# Patient Record
Sex: Male | Born: 1970
Health system: Southern US, Community
[De-identification: ages and names within clinical notes are randomized; demographics above are authoritative.]

## PROBLEM LIST (undated history)

## (undated) DIAGNOSIS — R011 Cardiac murmur, unspecified: Secondary | ICD-10-CM

## (undated) DIAGNOSIS — K5790 Diverticulosis of intestine, part unspecified, without perforation or abscess without bleeding: Secondary | ICD-10-CM

## (undated) DIAGNOSIS — I10 Essential (primary) hypertension: Secondary | ICD-10-CM

## (undated) DIAGNOSIS — R002 Palpitations: Secondary | ICD-10-CM

## (undated) DIAGNOSIS — K219 Gastro-esophageal reflux disease without esophagitis: Secondary | ICD-10-CM

## (undated) DIAGNOSIS — K648 Other hemorrhoids: Secondary | ICD-10-CM

## (undated) DIAGNOSIS — C8190 Hodgkin lymphoma, unspecified, unspecified site: Secondary | ICD-10-CM

## (undated) DIAGNOSIS — D126 Benign neoplasm of colon, unspecified: Secondary | ICD-10-CM

## (undated) HISTORY — PX: OTHER SURGICAL HISTORY: SHX169

## (undated) HISTORY — DX: Palpitations: R00.2

## (undated) HISTORY — PX: LYMPH NODE BIOPSY: SHX201

## (undated) HISTORY — DX: Gastro-esophageal reflux disease without esophagitis: K21.9

## (undated) HISTORY — DX: Cardiac murmur, unspecified: R01.1

## (undated) HISTORY — DX: Diverticulosis of intestine, part unspecified, without perforation or abscess without bleeding: K57.90

## (undated) HISTORY — DX: Essential (primary) hypertension: I10

## (undated) HISTORY — DX: Other hemorrhoids: K64.8

## (undated) HISTORY — DX: Hodgkin lymphoma, unspecified, unspecified site: C81.90

## (undated) HISTORY — DX: Benign neoplasm of colon, unspecified: D12.6

---

## 2010-05-29 HISTORY — PX: COLONOSCOPY: SHX174

## 2012-05-29 DIAGNOSIS — C8190 Hodgkin lymphoma, unspecified, unspecified site: Secondary | ICD-10-CM

## 2012-05-29 HISTORY — DX: Hodgkin lymphoma, unspecified, unspecified site: C81.90

## 2016-11-10 ENCOUNTER — Telehealth: Payer: Self-pay | Admitting: Hematology

## 2016-11-10 ENCOUNTER — Encounter: Payer: Self-pay | Admitting: Hematology

## 2016-11-10 NOTE — Telephone Encounter (Signed)
Appt has been scheduled for the pt to see Dr. Irene Limbo on 7/19 at 3pm. Pt agreed to the appt date and time. Demographics verified. Letter mailed to the pt and faxed to the referring.

## 2016-12-14 ENCOUNTER — Telehealth: Payer: Self-pay | Admitting: Hematology

## 2016-12-14 ENCOUNTER — Ambulatory Visit (HOSPITAL_BASED_OUTPATIENT_CLINIC_OR_DEPARTMENT_OTHER): Payer: 59 | Admitting: Hematology

## 2016-12-14 VITALS — BP 138/74 | HR 82 | Temp 98.0°F | Resp 20 | Ht 72.0 in | Wt 256.2 lb

## 2016-12-14 DIAGNOSIS — Z8571 Personal history of Hodgkin lymphoma: Secondary | ICD-10-CM

## 2016-12-14 DIAGNOSIS — C8118 Nodular sclerosis classical Hodgkin lymphoma, lymph nodes of multiple sites: Secondary | ICD-10-CM

## 2016-12-14 DIAGNOSIS — I10 Essential (primary) hypertension: Secondary | ICD-10-CM

## 2016-12-14 DIAGNOSIS — E669 Obesity, unspecified: Secondary | ICD-10-CM | POA: Diagnosis not present

## 2016-12-14 NOTE — Telephone Encounter (Signed)
Scheduled appt per 7/19 los - Gave patient AVS and calender per los- lab appt today not scheduled due to lab closed -patient to call back and r/s .

## 2016-12-14 NOTE — Progress Notes (Signed)
[name]    HEMATOLOGY/ONCOLOGY CONSULTATION NOTE  Date of Service: 12/14/2016  Patient Care Team: Glenford Bayley, DO as PCP - General (Family Medicine)  CHIEF COMPLAINTS/PURPOSE OF CONSULTATION:  History of Hodgkin's lymphoma  HISTORY OF PRESENTING ILLNESS:  Marcus [name] is a wonderful 46 y.o. male who has been referred to [place] by Dr .Marin Comment, Tilden Fossa, DO for evaluation and continued management of Hodgkin's lymphoma.  Patient has previously been seen by Dr. Nicki Guadalajara MD at [facility] in [facility] for management of his Hodgkin's [place] and recently moved to [facility] in January 2018 and wanted to continue his follow-up for Hodgkin's lymphoma here.  Patient presented with bilateral cervical lymphadenopathy first noted in December 2013 and then evaluated with a CT of the neck in January 2014. He notes that he also had some night sweats and some fatigue along with the lymphadenopathy. Patient had a lymph node biopsy of the neck initially which was nondiagnostic.    patient had a PET CT scan on 07/11/2012 which showed extensive nodal hypermetabolic lesion throughout the outer neck bilaterally and mediastinum with SUV up to 11.6   Patient required 2 biopsies of his mediastinal nodes by interventional radiology to make a diagnosis of Hodgkin's lymphoma nodular sclerosis type in March 2014.   patient was treated with ABVD for 6 cycles February through September 2014  He has been in complete remission since then. His last clinic visit with medical oncology was in August 2017 when he had no clinical or lab findings suggestive of disease recurrence. He missed a follow-up in February 2018 since he was in the process of moving to [place].  Patient notes that he is enjoying his job in [place]. He notes no overt new enlarged lymph nodes. No fevers no chills no night sweats no unexpected weight loss. No chest pain no shortness of breath. No abdominal pain or distention. No new skin  rashes.   MEDICAL HISTORY:  #1 history of Hodgkin's lymphoma diagnosed in April 2014 status post ABVD 6 cycles completed in September 2014. Has remained in complete remission since then. #2 obesity #3 hypertension  SURGICAL HISTORY: No past surgical history on file.  SOCIAL HISTORY: Social History   Social History  . Marital status: Unknown    Spouse name: N/A  . Number of children: N/A  . Years of education: N/A   Occupational History  . Not on file.   Social History Main Topics  . Smoking status: Not on file  . Smokeless tobacco: Not on file  . Alcohol use Not on file  . Drug use: Unknown  . Sexual activity: Not on file   Other Topics Concern  . Not on file   Social History Narrative  . No narrative on file  Works as a Optician, dispensing. Married. Has one son Recently moved to [facility] from [place] in January 2018. Remote history of smoking quit in 2011. Social alcohol use Denies any other drug use  FAMILY HISTORY:  Father had nephrectomy for kidney cancer in 68 Mother had non-Hodgkin's lymphoma followed by Hodgkin's lymphoma and eventually died from adenocarcinoma of unknown primary.   ALLERGIES:  has no allergies on file.  MEDICATIONS:  Current Outpatient Prescriptions  Medication Sig Dispense Refill  . losartan (COZAAR) 25 MG tablet Take 25 mg by mouth daily.  5  . SF 5000 PLUS 1.1 % CREA dental cream Take 1 application by mouth daily.  0   No current facility-administered  medications for this visit.     REVIEW OF SYSTEMS:    10 Point review of Systems was done is negative except as noted above.  PHYSICAL EXAMINATION: ECOG PERFORMANCE STATUS: 0 - Asymptomatic  . Vitals:   12/14/16 1527  BP: 138/74  Pulse: 82  Resp: 20  Temp: 98 F (36.7 C)   Filed Weights   12/14/16 1527  Weight: 256 lb 3.2 oz (116.2 kg)   .Body mass index is 34.75 kg/m.  GENERAL:alert, in no acute distress and  comfortable SKIN: no acute rashes, no significant lesions EYES: conjunctiva are pink and non-injected, sclera anicteric OROPHARYNX: MMM, no exudates, no oropharyngeal erythema or ulceration NECK: supple, no JVD LYMPH:  no palpable lymphadenopathy in the cervical, axillary or inguinal regions LUNGS: clear to auscultation b/l with normal respiratory effort HEART: regular rate & rhythm ABDOMEN:  normoactive bowel sounds , non tender, not distended. No palpable hepatosplenomegaly. Extremity: no pedal edema PSYCH: alert & oriented x 3 with fluent speech NEURO: no focal motor/sensory deficits  LABORATORY DATA:  I have reviewed the data as listed  Labs pending from today  RADIOGRAPHIC STUDIES: I have personally reviewed the radiological images as listed and agreed with the findings in the report. No results found.  ASSESSMENT & PLAN:   46 yo with   1) h/o Classical Hodgkins Lymphoma -Nodular sclerosis type Stage IIB  Diagnosed in January 2014 Treated with ABVD 6 cycles completed in September 2014. Patient has remained in complete remission since then. Plan -Patient has no clinical evidence of Hodgkin's lymphoma recurrence at this time. -We shall get labs including CBC with differential, CMP and sedimentation rate today. -Unless overtly concerning lab abnormalities noted there is no recommendation to get routine whole-body scans at this time. -Will plan to follow him up in 6 months with repeat labs and clinical examination. -All the outside records were reviewed in details with the patient and confirmed. -He has no residual prohibitive toxicities from his ABVD treatment. -Encouraged healthy eating and active lifestyle and weight loss to reduce cardiovascular risk.  2) HTN - on losartan  3) Obesity - counseling and active lifestyle and appropriate diet.  Labs today RTC with Dr Irene Limbo in 6 months with rpt labs  All of the patients questions were answered with apparent satisfaction.  The patient knows to call the clinic with any problems, questions or concerns.  I spent 40 minutes counseling the patient face to face. The total time spent in the appointment was 60 minutes and more than 50% was on counseling and direct patient cares.    Dittman Lone MD [place] AAHIVMS [facility] Hematology/Oncology Physician [facility]  (Office):       [phone] (Work cell):  [phone] (Fax):           [phone]  12/14/2016 2:48 PM

## 2017-05-01 ENCOUNTER — Telehealth: Payer: Self-pay

## 2017-05-01 NOTE — Telephone Encounter (Signed)
SENT NOTES TO SCHEDULING 

## 2017-06-06 ENCOUNTER — Telehealth: Payer: Self-pay | Admitting: Cardiology

## 2017-06-06 NOTE — Telephone Encounter (Signed)
06/06/2017 Received faxed referral from [facility] for upcoming appointment with Dr. Martinique on 06/25/2017.  Records given to [facility]. cbr

## 2017-06-15 ENCOUNTER — Ambulatory Visit: Payer: 59 | Admitting: Hematology

## 2017-06-15 ENCOUNTER — Other Ambulatory Visit: Payer: 59

## 2017-06-20 NOTE — Progress Notes (Signed)
Cardiology Office Note   Date:  06/25/2017   ID:  Marcus [name], DOB 03-Mar-1971, MRN [id number]  PCP:  Glenford Bayley, DO  Cardiologist:   Tiwanna Tuch Martinique, MD   Chief Complaint  Patient presents with  . Follow-up    NP. 2007 DX of palpitations.  . Shortness of Breath      History of Present Illness: Marcus [name] is a 47 y.o. male who is seen at the request of Mat Carne PA for evaluation of dyspnea and chest pain. He has a history of Hodgkin's lymphoma in 2014 treated with chemotherapy and doing well. He reports seeing cardiologist in [place] about 6 years ago and had a normal stress test at that time. He reports in the last 4 years he has let himself go- not eating well, no exercising and gaining weight. He notes symptoms of dyspnea. This typically occurs with exertion but may happen anytime. He also notes some chest discomfort at times- not associated with activity. Central chest. Feels this is more frequent. It typically lasts a few minutes. He is interested in starting back exercising and eating better. Wants to make sure his heart is OK.     Past Medical History:  Diagnosis Date  . Hodgkin's disease ([place])    In remission.  . Hodgkin's lymphoma ([place]) 2014   treated with chemotherapy  . Hypertension   . Palpitations     Past Surgical History:  Procedure Laterality Date  . ganglion cyst removal       Current Outpatient Medications  Medication Sig Dispense Refill  . losartan (COZAAR) 25 MG tablet Take 25 mg by mouth daily.  5  . SF 5000 PLUS 1.1 % CREA dental cream Take 1 application by mouth daily.  0   No current facility-administered medications for this visit.     Allergies:   Patient has no known allergies.    Social History:  The patient  reports that he quit smoking about 8 years ago. he has never used smokeless tobacco. He reports that he drinks about 8.4 oz of alcohol per week. He reports that he does not use drugs.   Family History:  The patient's  family history includes Cancer in his mother; Heart disease in his father; Kidney cancer in his father.    ROS:  Please see the history of present illness.   Otherwise, review of systems are positive for none.   All other systems are reviewed and negative.    PHYSICAL EXAM: VS:  BP 116/82 Comment: Right arm.  Pulse 75   Ht 6' (1.829 m)   Wt 246 lb (111.6 kg)   BMI 33.36 kg/m  , BMI Body mass index is 33.36 kg/m. GEN: Well nourished, well developed, in no acute distress  HEENT: normal  Neck: no JVD, carotid bruits, or masses Cardiac: RRR; no murmurs, rubs, or gallops,no edema  Respiratory:  clear to auscultation bilaterally, normal work of breathing GI: soft, nontender, nondistended, + BS MS: no deformity or atrophy  Skin: warm and dry, no rash Neuro:  Strength and sensation are intact Psych: euthymic mood, full affect   EKG:  EKG is ordered today. The ekg ordered today demonstrates NSR rate 75. Normal Ecg. I have personally reviewed and interpreted this study.    Recent Labs: No results found for requested labs within last 8760 hours.    Lipid Panel No results found for: CHOL, TRIG, HDL, CHOLHDL, VLDL, LDLCALC, LDLDIRECT    Wt Readings from  Last 3 Encounters:  06/25/17 246 lb (111.6 kg)  12/14/16 256 lb 3.2 oz (116.2 kg)      Other studies Reviewed: Additional studies/ records that were reviewed today include:  Labs dated 04/30/17: cholesterol 191, triglycerides 67, HDL 65, LDL 113. CBC and chemistries normal.     ASSESSMENT AND PLAN:  1. Chest discomfort and dyspnea. Risk factors of HTN and borderline hypercholesterolemia. Recommend ETT to evaluate cardiac risk. If normal would focus on lifestyle modification with weight loss, regular aerobic activity and heart healthy diet.  2. HTN controlled. 3. Hodgkin's lymphoma in remission.   Current medicines are reviewed at length with the patient today.  The patient does not have concerns regarding medicines.  The  following changes have been made:  no change  Labs/ tests ordered today include:   Orders Placed This Encounter  Procedures  . Exercise Tolerance Test  . EKG 12-Lead     Signed, Kou Gucciardo Martinique, MD  06/25/2017 9:42 AM    [facility] [address] [place], [place], [address] Phone [phone], Fax [phone]

## 2017-06-22 ENCOUNTER — Telehealth: Payer: Self-pay | Admitting: Hematology

## 2017-06-22 NOTE — Telephone Encounter (Signed)
Called regarding 2/25

## 2017-06-25 ENCOUNTER — Ambulatory Visit: Payer: 59 | Admitting: Cardiology

## 2017-06-25 ENCOUNTER — Encounter: Payer: Self-pay | Admitting: Cardiology

## 2017-06-25 VITALS — BP 116/82 | HR 75 | Ht 72.0 in | Wt 246.0 lb

## 2017-06-25 DIAGNOSIS — I10 Essential (primary) hypertension: Secondary | ICD-10-CM | POA: Diagnosis not present

## 2017-06-25 DIAGNOSIS — R079 Chest pain, unspecified: Secondary | ICD-10-CM

## 2017-06-25 DIAGNOSIS — R0600 Dyspnea, unspecified: Secondary | ICD-10-CM | POA: Diagnosis not present

## 2017-06-25 NOTE — Patient Instructions (Signed)
We will schedule you for an exercise stress test

## 2017-06-27 ENCOUNTER — Telehealth (HOSPITAL_COMMUNITY): Payer: Self-pay

## 2017-06-27 ENCOUNTER — Telehealth (HOSPITAL_COMMUNITY): Payer: Self-pay | Admitting: Cardiology

## 2017-06-27 NOTE — Telephone Encounter (Signed)
Wrong provider used.

## 2017-06-27 NOTE — Telephone Encounter (Signed)
Encounter complete. 

## 2017-06-28 NOTE — Telephone Encounter (Signed)
User: Cherie Dark A Date/time: 06/27/17 3:33 PM  Comment: Called pt and lmsg for him to CB to r/s ETT that he isnt' able to make it in for on 2/1.Marland KitchenRG  Context:  Outcome: Left Message  Phone number: [phone] Phone Type: Home Phone  Comm. type: Telephone Call type: Outgoing  Contact: Emeline Gins T Relation to patient: Self   Called pt and spoke with him , and rescheduled his ETT appt to 07/05/17 at 3:30pm.

## 2017-06-29 ENCOUNTER — Ambulatory Visit (HOSPITAL_COMMUNITY): Admission: RE | Admit: 2017-06-29 | Payer: 59 | Source: Ambulatory Visit | Attending: Cardiology | Admitting: Cardiology

## 2017-07-03 ENCOUNTER — Telehealth (HOSPITAL_COMMUNITY): Payer: Self-pay

## 2017-07-03 NOTE — Telephone Encounter (Signed)
Encounter complete. 

## 2017-07-05 ENCOUNTER — Ambulatory Visit (HOSPITAL_COMMUNITY)
Admission: RE | Admit: 2017-07-05 | Discharge: 2017-07-05 | Disposition: A | Discharge status: Home / Self Care | Payer: 59 | Source: Ambulatory Visit | Attending: Cardiovascular Disease | Admitting: Cardiovascular Disease

## 2017-07-05 DIAGNOSIS — I10 Essential (primary) hypertension: Secondary | ICD-10-CM | POA: Diagnosis not present

## 2017-07-05 DIAGNOSIS — R0600 Dyspnea, unspecified: Secondary | ICD-10-CM | POA: Diagnosis not present

## 2017-07-05 DIAGNOSIS — R079 Chest pain, unspecified: Secondary | ICD-10-CM | POA: Diagnosis present

## 2017-07-05 LAB — EXERCISE TOLERANCE TEST
CHL CUP MPHR: 174 {beats}/min
CHL RATE OF PERCEIVED EXERTION: 18
CSEPEDS: 48 s
CSEPEW: 11.3 METS
CSEPPHR: 171 {beats}/min
Exercise duration (min): 9 min
Percent HR: 98 %
Rest HR: 75 {beats}/min

## 2017-07-06 ENCOUNTER — Other Ambulatory Visit: Payer: Self-pay

## 2017-07-06 ENCOUNTER — Telehealth: Payer: Self-pay | Admitting: Cardiology

## 2017-07-06 DIAGNOSIS — R9439 Abnormal result of other cardiovascular function study: Secondary | ICD-10-CM

## 2017-07-06 DIAGNOSIS — R0602 Shortness of breath: Secondary | ICD-10-CM

## 2017-07-06 DIAGNOSIS — R072 Precordial pain: Secondary | ICD-10-CM

## 2017-07-06 MED ORDER — METOPROLOL TARTRATE 50 MG PO TABS: ORAL_TABLET | ORAL | 0 refills | Status: DC

## 2017-07-06 NOTE — Telephone Encounter (Signed)
-----   Message from Blenda Bridegroom sent at 07/06/2017  7:16 AM EST ----- Regarding: Abnormal ETT Abnormal ETT read by Dr Aundra Dubin on Jul 06, 2016. This test was ordered by Dr Martinique.

## 2017-07-06 NOTE — Telephone Encounter (Signed)
ETT report from 07/05/17  1. Normal exercise tolerance.  2. There was 1 mm ST depression in V4/V5 and 2 mm ST depression in the inferior leads at peak exertion.  This resolved in < 1 minute at rest.   Intermediate risk study, would consider stress imaging or coronary CT angiogram

## 2017-07-11 NOTE — Telephone Encounter (Signed)
Dr.Jordan reviewed coronary ct ordered.

## 2017-07-13 ENCOUNTER — Telehealth: Payer: Self-pay

## 2017-07-13 NOTE — Telephone Encounter (Signed)
Spoken with patient concerning appointment wanted it R/S due to job. Per 2/15 phone message return

## 2017-07-19 ENCOUNTER — Telehealth: Payer: Self-pay

## 2017-07-19 NOTE — Telephone Encounter (Signed)
Please arrive at the [facility] main entrance of [facility] Friday 08/03/17 at 7:45 AM (30-45 minutes prior to test start time)  [facility] [place], [place] [address] [phone]  Proceed to the [facility] Radiology Department (First Floor).  Please follow these instructions carefully (unless otherwise directed):  Hold all erectile dysfunction medications at least 48 hours prior to test.  On the Night Before the Test: . Drink plenty of water. . Do not consume any caffeinated/decaffeinated beverages or chocolate 12 hours prior to your test. . Do not take any antihistamines 12 hours prior to your test.   On the Day of the Test: . Drink plenty of water. Do not drink any water within one hour of the test. . Do not eat any food 4 hours prior to the test. . You may take your regular medications prior to the test. . IF NOT ON A BETA BLOCKER - Take 50 mg of lopressor (metoprolol) one hour before the test. . HOLD Furosemide morning of the test.  After the Test: . Drink plenty of water. . After receiving IV contrast, you may experience a mild flushed feeling. This is normal. . On occasion, you may experience a mild rash up to 24 hours after the test. This is not dangerous. If this occurs, you can take Benadryl 25 mg and increase your fluid intake. . If you experience trouble breathing, this can be serious. If it is severe call 911 IMMEDIATELY. If it is mild, please call our office. . If you take any of these medications: Glipizide/Metformin, Avandament, Glucavance, please do not take 48 hours after completing test.

## 2017-07-23 ENCOUNTER — Other Ambulatory Visit: Payer: 59

## 2017-07-23 ENCOUNTER — Ambulatory Visit: Payer: 59 | Admitting: Hematology

## 2017-07-23 NOTE — Progress Notes (Signed)
[name]    HEMATOLOGY/ONCOLOGY CONSULTATION NOTE  Date of Service: 07/25/2017  Patient Care Team: Glenford Bayley, DO as PCP - General (Family Medicine)  CHIEF COMPLAINTS/PURPOSE OF CONSULTATION:   F/u for remote h/o Hodgkins lymphoma  HISTORY OF PRESENTING ILLNESS:   Marcus [name] is a wonderful 47 y.o. male who has been referred to [place] by Dr .Marin Comment, Tilden Fossa, DO for evaluation and continued management of Hodgkin's lymphoma.  Patient has previously been seen by Dr. Nicki Guadalajara MD at [facility] in [facility] for management of his Hodgkin's [place] and recently moved to [facility] in January 2018 and wanted to continue his follow-up for Hodgkin's lymphoma here.  Patient presented with bilateral cervical lymphadenopathy first noted in December 2013 and then evaluated with a CT of the neck in January 2014. He notes that he also had some night sweats and some fatigue along with the lymphadenopathy. Patient had a lymph node biopsy of the neck initially which was nondiagnostic.    patient had a PET CT scan on 07/11/2012 which showed extensive nodal hypermetabolic lesion throughout the outer neck bilaterally and mediastinum with SUV up to 11.6   Patient required 2 biopsies of his mediastinal nodes by interventional radiology to make a diagnosis of Hodgkin's lymphoma nodular sclerosis type in March 2014.   patient was treated with ABVD for 6 cycles February through September 2014  He has been in complete remission since then. His last clinic visit with medical oncology was in August 2017 when he had no clinical or lab findings suggestive of disease recurrence. He missed a follow-up in February 2018 since he was in the process of moving to [place].  Patient notes that he is enjoying his job in [place]. He notes no overt new enlarged lymph nodes. No fevers no chills no night sweats no unexpected weight loss. No chest pain no shortness of breath. No abdominal pain or  distention. No new skin rashes.  Interval History:  Marcus [name] returns today regarding his history of Hodgkin's Lymphoma. The patient's last visit with [place] was on 12/14/16. The pt reports that he is doing very well and enjoyed all his holidays since his last visit. He reports that he has established care with cardiologist Dr. Peter Martinique recently for a routine EKG and will follow up with a Coronary CT next Friday, 08/03/17. The pt notes that he is currently not seeing a PCP.  Lab results today (07/25/17) of CBC, CMP, and Reticulocytes is as follows: all values are WNL except for WBC at 3.3k. Sedimentation rate 07/25/17 is pending.  On review of systems, pt denies fevers, chills, night sweats, unexpected weight loss, skin itching, skin rashes, changes in energy levels, problems with sleeping, pain along the spine, abdominal pains, bowel irregularities, leg swelling, and any other symptoms. No new lumps or bumps.   MEDICAL HISTORY:  #1 history of Hodgkin's lymphoma diagnosed in April 2014 status post ABVD 6 cycles completed in September 2014. Has remained in complete remission since then. #2 obesity #3 hypertension  SURGICAL HISTORY: Past Surgical History:  Procedure Laterality Date  . ganglion cyst removal      SOCIAL HISTORY: Social History   Socioeconomic History  . Marital status: Married    Spouse name: Not on file  . Number of children: 1  . Years of education: Not on file  . Highest education level: Not on file  Social Needs  . Financial resource strain: Not on file  . Food insecurity -  worry: Not on file  . Food insecurity - inability: Not on file  . Transportation needs - medical: Not on file  . Transportation needs - non-medical: Not on file  Occupational History  . Not on file  Tobacco Use  . Smoking status: Former Smoker    Last attempt to quit: 2011    Years since quitting: 8.1  . Smokeless tobacco: Never Used  Substance and Sexual Activity  . Alcohol use:  Yes    Alcohol/week: 8.4 oz    Types: 14 Cans of beer per week  . Drug use: No  . Sexual activity: Not on file  Other Topics Concern  . Not on file  Social History Narrative  . 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 Remote history of smoking quit in 2011. Social alcohol use Denies any other drug use  FAMILY HISTORY:  Father had nephrectomy for kidney cancer in 6 Mother had non-Hodgkin's lymphoma followed by Hodgkin's lymphoma and eventually died from adenocarcinoma of unknown primary.   ALLERGIES:  has No Known Allergies.  MEDICATIONS:  Current Outpatient Medications  Medication Sig Dispense Refill  . losartan (COZAAR) 25 MG tablet Take 25 mg by mouth daily.  5  . metoprolol tartrate (LOPRESSOR) 50 MG tablet Take 50 mg 1 hour before cardiac ct 1 tablet 0  . SF 5000 PLUS 1.1 % CREA dental cream Take 1 application by mouth daily.  0   No current facility-administered medications for this visit.     REVIEW OF SYSTEMS:    .10 Point review of Systems was done is negative except as noted above.   PHYSICAL EXAMINATION: ECOG PERFORMANCE STATUS: 0 - Asymptomatic  . Vitals:   07/25/17 0851  BP: 125/80  Pulse: 70  Resp: 18  Temp: 97.6 F (36.4 C)  SpO2: 99%   Filed Weights   07/25/17 0851  Weight: 245 lb 12.8 oz (111.5 kg)   .Body mass index is 33.34 kg/m.  [name] GENERAL:alert, in no acute distress and comfortable SKIN: no acute rashes, no significant lesions EYES: conjunctiva are pink and non-injected, sclera anicteric OROPHARYNX: MMM, no exudates, no oropharyngeal erythema or ulceration NECK: supple, no JVD LYMPH:  no palpable lymphadenopathy in the cervical, axillary or inguinal regions LUNGS: clear to auscultation b/l with normal respiratory effort HEART: regular rate & rhythm ABDOMEN:  normoactive bowel sounds , non tender, not  distended. Extremity: no pedal edema PSYCH: alert & oriented x 3 with fluent speech NEURO: no focal motor/sensory deficits    LABORATORY DATA:  I have reviewed the data as listed  . CBC Latest Ref Rng & Units 07/25/2017  WBC 4.0 - 10.3 K/uL 3.3(L)  Hemoglobin 13.0 - 17.1 g/dL 15.0  Hematocrit 38.4 - 49.9 % 43.8  Platelets 140 - 400 K/uL 170   . CBC    Component Value Date/Time   WBC 3.3 (L) 07/25/2017 0812   RBC 4.97 07/25/2017 0812   RBC 4.97 07/25/2017 0812   HGB 15.0 07/25/2017 0812   HCT 43.8 07/25/2017 0812   PLT 170 07/25/2017 0812   MCV 88.1 07/25/2017 0812   MCH 30.2 07/25/2017 0812   MCHC 34.2 07/25/2017 0812   RDW 12.6 07/25/2017 0812   LYMPHSABS 1.3 07/25/2017 0812   MONOABS 0.4 07/25/2017 0812   EOSABS 0.1 07/25/2017 0812   BASOSABS 0.0 07/25/2017 0812   . CMP Latest Ref Rng & Units  07/25/2017  Glucose 70 - 140 mg/dL 93  BUN 7 - 26 mg/dL 16  Creatinine 0.70 - 1.30 mg/dL 1.01  Sodium 136 - 145 mmol/L 138  Potassium 3.5 - 5.1 mmol/L 4.8  Chloride 98 - 109 mmol/L 105  CO2 22 - 29 mmol/L 25  Calcium 8.4 - 10.4 mg/dL 10.4  Total Protein 6.4 - 8.3 g/dL 6.8  Total Bilirubin 0.2 - 1.2 mg/dL 0.7  Alkaline Phos 40 - 150 U/L 65  AST 5 - 34 U/L 24  ALT 0 - 55 U/L 27   Sed rate 0  RADIOGRAPHIC STUDIES: I have personally reviewed the radiological images as listed and agreed with the findings in the report. No results found.  ASSESSMENT & PLAN:   47 yo male with   1) h/o Classical Hodgkins Lymphoma -Nodular sclerosis type Stage IIB  Diagnosed in January 2014 Treated with ABVD 6 cycles completed in September 2014. Patient has remained in complete remission since then.  Plan -Discussed pt labwork today; CBC and CMP both look great. No neutropenia; WBC are borderline low.  -Discussed limiting salt diet to around 2g and counseled pt in healthy lifestyle choices and being more active.  -No clinical signs or symptoms or lab evidence of recurrent hodgkins  lymphoma at this time -Recommended flu shot and pneumonia vaccines in a post-chemotherapy setting for the pt. The pt notes he will consider this and will let us know what he concludes. -Advised establishing care with a PCP and to let us know when he does.   2) HTN - on losartan  3) Obesity - counseling and active lifestyle and appropriate diet.  Labs today RTC with Dr Irene Limbo in 6 months with rpt labs  All of the patients questions were answered with apparent satisfaction. The patient knows to call the clinic with any problems, questions or concerns.   The total time spent in the appointment was 20 minutes and more than 50% was on counseling and direct patient cares.     Crampton Lone MD [place] AAHIVMS [facility] Hematology/Oncology Physician [facility]  (Office):       [phone] (Work cell):  [phone] (Fax):           [phone]  07/25/2017 8:33 AM   This document serves as a record of services personally performed by Laux Lone, MD. It was created on his behalf by Baldwin Jamaica, a trained medical scribe. The creation of this record is based on the scribe's personal observations and the provider's statements to them.   .I have reviewed the above documentation for accuracy and completeness, and I agree with the above. Brunetta Genera MD MS

## 2017-07-25 ENCOUNTER — Telehealth: Payer: Self-pay | Admitting: Hematology

## 2017-07-25 ENCOUNTER — Inpatient Hospital Stay (HOSPITAL_BASED_OUTPATIENT_CLINIC_OR_DEPARTMENT_OTHER): Payer: 59 | Admitting: Hematology

## 2017-07-25 ENCOUNTER — Encounter: Payer: Self-pay | Admitting: Hematology

## 2017-07-25 ENCOUNTER — Inpatient Hospital Stay: Payer: 59 | Attending: Hematology

## 2017-07-25 VITALS — BP 125/80 | HR 70 | Temp 97.6°F | Resp 18 | Ht 72.0 in | Wt 245.8 lb

## 2017-07-25 DIAGNOSIS — Z8571 Personal history of Hodgkin lymphoma: Secondary | ICD-10-CM | POA: Insufficient documentation

## 2017-07-25 DIAGNOSIS — Z807 Family history of other malignant neoplasms of lymphoid, hematopoietic and related tissues: Secondary | ICD-10-CM | POA: Diagnosis not present

## 2017-07-25 DIAGNOSIS — E669 Obesity, unspecified: Secondary | ICD-10-CM | POA: Insufficient documentation

## 2017-07-25 DIAGNOSIS — Z8051 Family history of malignant neoplasm of kidney: Secondary | ICD-10-CM | POA: Diagnosis not present

## 2017-07-25 DIAGNOSIS — C8118 Nodular sclerosis classical Hodgkin lymphoma, lymph nodes of multiple sites: Secondary | ICD-10-CM

## 2017-07-25 DIAGNOSIS — I10 Essential (primary) hypertension: Secondary | ICD-10-CM | POA: Diagnosis not present

## 2017-07-25 LAB — RETICULOCYTES
RBC.: 4.97 MIL/uL (ref 4.20–5.82)
RETIC COUNT ABSOLUTE: 49.7 10*3/uL (ref 34.8–93.9)
Retic Ct Pct: 1 % (ref 0.8–1.8)

## 2017-07-25 LAB — COMPREHENSIVE METABOLIC PANEL
ALBUMIN: 4 g/dL (ref 3.5–5.0)
ALK PHOS: 65 U/L (ref 40–150)
ALT: 27 U/L (ref 0–55)
ANION GAP: 8 (ref 3–11)
AST: 24 U/L (ref 5–34)
BILIRUBIN TOTAL: 0.7 mg/dL (ref 0.2–1.2)
BUN: 16 mg/dL (ref 7–26)
CALCIUM: 10.4 mg/dL (ref 8.4–10.4)
CO2: 25 mmol/L (ref 22–29)
CREATININE: 1.01 mg/dL (ref 0.70–1.30)
Chloride: 105 mmol/L (ref 98–109)
GFR calc Af Amer: >60 mL/min (ref >60)
GFR calc non Af Amer: >60 mL/min (ref >60)
Glucose, Bld: 93 mg/dL (ref 70–140)
Potassium: 4.8 mmol/L (ref 3.5–5.1)
Sodium: 138 mmol/L (ref 136–145)
TOTAL PROTEIN: 6.8 g/dL (ref 6.4–8.3)

## 2017-07-25 LAB — CBC WITH DIFFERENTIAL/PLATELET
BASOS ABS: 0 10*3/uL (ref 0.0–0.1)
BASOS PCT: 1 %
EOS ABS: 0.1 10*3/uL (ref 0.0–0.5)
EOS PCT: 4 %
HCT: 43.8 % (ref 38.4–49.9)
Hemoglobin: 15 g/dL (ref 13.0–17.1)
Lymphocytes Relative: 39 %
Lymphs Abs: 1.3 10*3/uL (ref 0.9–3.3)
MCH: 30.2 pg (ref 27.2–33.4)
MCHC: 34.2 g/dL (ref 32.0–36.0)
MCV: 88.1 fL (ref 79.3–98.0)
MONO ABS: 0.4 10*3/uL (ref 0.1–0.9)
Monocytes Relative: 11 %
Neutro Abs: 1.5 10*3/uL (ref 1.5–6.5)
Neutrophils Relative %: 45 %
PLATELETS: 170 10*3/uL (ref 140–400)
RBC: 4.97 MIL/uL (ref 4.20–5.82)
RDW: 12.6 % (ref 11.0–14.6)
WBC: 3.3 10*3/uL — ABNORMAL LOW (ref 4.0–10.3)

## 2017-07-25 LAB — SEDIMENTATION RATE: Sed Rate: 0 mm/hr (ref 0–16)

## 2017-07-25 NOTE — Telephone Encounter (Signed)
Scheduled appt per 2/27 los - Gave patient AVS and calender per los.  

## 2017-08-03 ENCOUNTER — Ambulatory Visit (HOSPITAL_COMMUNITY): Admission: RE | Admit: 2017-08-03 | Payer: 59 | Source: Ambulatory Visit

## 2017-08-03 ENCOUNTER — Ambulatory Visit (HOSPITAL_COMMUNITY)
Admission: RE | Admit: 2017-08-03 | Discharge: 2017-08-03 | Disposition: A | Discharge status: Home / Self Care | Payer: 59 | Source: Ambulatory Visit | Attending: Cardiology | Admitting: Cardiology

## 2017-08-03 DIAGNOSIS — R222 Localized swelling, mass and lump, trunk: Secondary | ICD-10-CM | POA: Diagnosis not present

## 2017-08-03 DIAGNOSIS — R9439 Abnormal result of other cardiovascular function study: Secondary | ICD-10-CM | POA: Diagnosis present

## 2017-08-03 DIAGNOSIS — R0602 Shortness of breath: Secondary | ICD-10-CM | POA: Diagnosis present

## 2017-08-03 DIAGNOSIS — R072 Precordial pain: Secondary | ICD-10-CM | POA: Diagnosis present

## 2017-08-03 DIAGNOSIS — R079 Chest pain, unspecified: Secondary | ICD-10-CM | POA: Diagnosis not present

## 2017-08-03 MED ORDER — NITROGLYCERIN 0.4 MG SL SUBL
SUBLINGUAL_TABLET | SUBLINGUAL | Status: AC
  Administered 2017-08-03: 0.8 mg via SUBLINGUAL
  Filled 2017-08-03: qty 2

## 2017-08-03 MED ORDER — METOPROLOL TARTRATE 5 MG/5ML IV SOLN
5.0000 mg | INTRAVENOUS | Status: DC | PRN
  Administered 2017-08-03: 5 mg via INTRAVENOUS
  Filled 2017-08-03: qty 5

## 2017-08-03 MED ORDER — METOPROLOL TARTRATE 5 MG/5ML IV SOLN
INTRAVENOUS | Status: AC
  Administered 2017-08-03: 5 mg via INTRAVENOUS
  Filled 2017-08-03: qty 20

## 2017-08-03 MED ORDER — IOPAMIDOL (ISOVUE-370) INJECTION 76%
INTRAVENOUS | Status: AC
  Administered 2017-08-03: 80 mL via INTRAVENOUS
  Filled 2017-08-03: qty 100

## 2017-08-03 MED ORDER — NITROGLYCERIN 0.4 MG SL SUBL
0.8000 mg | SUBLINGUAL_TABLET | Freq: Once | SUBLINGUAL | Status: AC
  Administered 2017-08-03: 0.8 mg via SUBLINGUAL
  Filled 2017-08-03: qty 25

## 2017-08-06 ENCOUNTER — Telehealth: Payer: Self-pay | Admitting: Cardiology

## 2017-08-06 NOTE — Telephone Encounter (Signed)
Follow Up:    Pt would like his CT results from 08-03-17 please.

## 2017-08-07 NOTE — Telephone Encounter (Signed)
Spoke to patient 08/06/17 coronary ct results given.

## 2017-08-14 ENCOUNTER — Telehealth: Payer: Self-pay | Admitting: Hematology

## 2017-08-14 NOTE — Telephone Encounter (Signed)
Received a note from patients cardiologist about incidentally noted ant mediastinal mass . Patient notes no new chest symptoms , labs stable and no clinical or lab evidence of hodgkins lymphoma recurrence at this time. I called and discussed this with the patient. I recommended he drop off disc of previous imaging studies (post treatment baseline) from [facility] [place] to determine if there was a residual post treatment anterior mediastinal mass.  He is agreeable to this idea. If new symptoms would rpt PET/CT. Has f/u in 6 months with labs. Sooner if any new concerns.  Brunetta Genera  MD MS

## 2018-01-21 NOTE — Progress Notes (Signed)
Marcus Kitchen    HEMATOLOGY/ONCOLOGY CL NOTE  Date of Service: 01/22/2018  Patient Care Team: Glenford Bayley, DO as PCP - General (Family Medicine)  CHIEF COMPLAINTS/PURPOSE OF CONSULTATION:   F/u for remote h/o Hodgkins lymphoma  HISTORY OF PRESENTING ILLNESS:   Marcus [name] is a wonderful 47 y.o. male who has been referred to [place] by Dr .Marin Comment, Tilden Fossa, DO for evaluation and continued management of Hodgkin's lymphoma.  Patient has previously been seen by Dr. Nicki Guadalajara MD at [facility] in [facility] for management of his Hodgkin's [place] and recently moved to [facility] in January 2018 and wanted to continue his follow-up for Hodgkin's lymphoma here.  Patient presented with bilateral cervical lymphadenopathy first noted in December 2013 and then evaluated with a CT of the neck in January 2014. He notes that he also had some night sweats and some fatigue along with the lymphadenopathy. Patient had a lymph node biopsy of the neck initially which was nondiagnostic.    patient had a PET CT scan on 07/11/2012 which showed extensive nodal hypermetabolic lesion throughout the outer neck bilaterally and mediastinum with SUV up to 11.6   Patient required 2 biopsies of his mediastinal nodes by interventional radiology to make a diagnosis of Hodgkin's lymphoma nodular sclerosis type in March 2014.   patient was treated with ABVD for 6 cycles February through September 2014  He has been in complete remission since then. His last clinic visit with medical oncology was in August 2017 when he had no clinical or lab findings suggestive of disease recurrence. He missed a follow-up in February 2018 since he was in the process of moving to [place].  Patient notes that he is enjoying his job in [place]. He notes no overt new enlarged lymph nodes. No fevers no chills no night sweats no unexpected weight loss. No chest pain no shortness of breath. No abdominal pain or distention. No new skin  rashes.  Interval History:  Marcus [name] returns today regarding his history of Hodgkin's Lymphoma. The patient's last visit with [place] was on 07/25/17. The pt reports that he is doing well overall.   The pt reports that he saw his PCP at [facility] in the last three weeks, and began an antibiotic course of Doxycycline, completing it about one week ago. The pt also notes that he was stung by yellow jackets about 16 times a few days ago, and has been taking Aleve frequently.   Lab results today (01/22/18) of CBC w/diff, CMP, and Reticulocytes is as follows: all values are WNL except for WBC at 2.9k, ANC at 1.3k. Sed Rate 01/22/18  On review of systems, pt reports recent infection, stable energy levels, and denies noticing any new lumps or bumps, fevers, chills, night sweats, unexpected weight loss, new fatigue, pain along the spine, changes in bowel habits, abdominal pains, testicular pain/swelling, calf pains, leg swelling, and any other symptoms.    MEDICAL HISTORY:  #1 history of Hodgkin's lymphoma diagnosed in April 2014 status post ABVD 6 cycles completed in September 2014. Has remained in complete remission since then. #2 obesity #3 hypertension  SURGICAL HISTORY: Past Surgical History:  Procedure Laterality Date  . ganglion cyst removal      SOCIAL HISTORY: Social History   Socioeconomic History  . Marital status: Married    Spouse name: Not on file  . Number of children: 1  . Years of education: Not on file  . Highest education level: Not on  file  Occupational History  . Not on file  Social Needs  . Financial resource strain: Not on file  . Food insecurity:    Worry: Not on file    Inability: Not on file  . Transportation needs:    Medical: Not on file    Non-medical: Not on file  Tobacco Use  . Smoking status: Former Smoker    Last attempt to quit: 2011    Years since quitting: 8.6  . Smokeless tobacco: Never Used  Substance and Sexual Activity  . Alcohol use:  Yes    Alcohol/week: 14.0 standard drinks    Types: 14 Cans of beer per week  . Drug use: No  . Sexual activity: Not on file  Lifestyle  . Physical activity:    Days per week: Not on file    Minutes per session: Not on file  . Stress: Not on file  Relationships  . Social connections:    Talks on phone: Not on file    Gets together: Not on file    Attends religious service: Not on file    Active member of club or organization: Not on file    Attends meetings of clubs or organizations: Not on file    Relationship status: Not on file  . Intimate partner violence:    Fear of current or ex partner: Not on file    Emotionally abused: Not on file    Physically abused: Not on file    Forced sexual activity: Not on file  Other Topics Concern  . Not on file  Social History Narrative  . Not on file  Works as a Optician, dispensing. Married. Has one son Recently moved to [facility] from [place] in January 2018. Remote history of smoking quit in 2011. Social alcohol use Denies any other drug use  FAMILY HISTORY:  Father had nephrectomy for kidney cancer in 87 Mother had non-Hodgkin's lymphoma followed by Hodgkin's lymphoma and eventually died from adenocarcinoma of unknown primary.   ALLERGIES:  has No Known Allergies.  MEDICATIONS:  Current Outpatient Medications  Medication Sig Dispense Refill  . losartan (COZAAR) 25 MG tablet Take 25 mg by mouth daily.  5  . metoprolol tartrate (LOPRESSOR) 50 MG tablet Take 50 mg 1 hour before cardiac ct (Patient not taking: Reported on 01/22/2018) 1 tablet 0  . SF 5000 PLUS 1.1 % CREA dental cream Take 1 application by mouth daily.  0   No current facility-administered medications for this visit.     REVIEW OF SYSTEMS:    A 10+ POINT REVIEW OF SYSTEMS WAS OBTAINED including neurology, dermatology, psychiatry, cardiac, respiratory, lymph, extremities, GI, GU, Musculoskeletal, constitutional, breasts,  reproductive, HEENT.  All pertinent positives are noted in the HPI.  All others are negative.   PHYSICAL EXAMINATION: ECOG PERFORMANCE STATUS: 0 - Asymptomatic  . Vitals:   01/22/18 0826  BP: 118/80  Pulse: 72  Resp: 17  Temp: 98.4 F (36.9 C)  SpO2: 99%   Filed Weights   01/22/18 0826  Weight: 248 lb 12.8 oz (112.9 kg)   .Body mass index is 33.74 kg/m.  GENERAL:alert, in no acute distress and comfortable SKIN: no acute rashes, no significant lesions EYES: conjunctiva are pink and non-injected, sclera anicteric OROPHARYNX: MMM, no exudates, no oropharyngeal erythema or ulceration NECK: supple, no JVD LYMPH:  no palpable lymphadenopathy in the cervical, axillary or inguinal regions LUNGS: clear to auscultation b/l with normal respiratory effort HEART: regular rate &  rhythm ABDOMEN:  normoactive bowel sounds , non tender, not distended. No palpable hepatosplenomegaly.  Extremity: no pedal edema PSYCH: alert & oriented x 3 with fluent speech NEURO: no focal motor/sensory deficits   LABORATORY DATA:  I have reviewed the data as listed  . CBC Latest Ref Rng & Units 01/22/2018 07/25/2017  WBC 4.0 - 10.3 K/uL 2.9(L) 3.3(L)  Hemoglobin 13.0 - 17.1 g/dL 14.9 15.0  Hematocrit 38.4 - 49.9 % 43.2 43.8  Platelets 140 - 400 K/uL 169 170   . CBC    Component Value Date/Time   WBC 2.9 (L) 01/22/2018 0744   WBC 3.3 (L) 07/25/2017 0812   RBC 4.90 01/22/2018 0744   RBC 4.81 01/22/2018 0744   HGB 14.9 01/22/2018 0744   HCT 43.2 01/22/2018 0744   PLT 169 01/22/2018 0744   MCV 88.3 01/22/2018 0744   MCH 30.5 01/22/2018 0744   MCHC 34.6 01/22/2018 0744   RDW 12.9 01/22/2018 0744   LYMPHSABS 1.1 01/22/2018 0744   MONOABS 0.3 01/22/2018 0744   EOSABS 0.2 01/22/2018 0744   BASOSABS 0.1 01/22/2018 0744   . CMP Latest Ref Rng & Units 01/22/2018 07/25/2017  Glucose 70 - 99 mg/dL 97 93  BUN 6 - 20 mg/dL 17 16  Creatinine 0.61 - 1.24 mg/dL 1.00 1.01  Sodium 135 - 145 mmol/L 138 138   Potassium 3.5 - 5.1 mmol/L 4.6 4.8  Chloride 98 - 111 mmol/L 107 105  CO2 22 - 32 mmol/L 23 25  Calcium 8.9 - 10.3 mg/dL 10.0 10.4  Total Protein 6.5 - 8.1 g/dL 6.9 6.8  Total Bilirubin 0.3 - 1.2 mg/dL 0.5 0.7  Alkaline Phos 38 - 126 U/L 57 65  AST 15 - 41 U/L 30 24  ALT 0 - 44 U/L 35 27   Sed rate 0  RADIOGRAPHIC STUDIES: I have personally reviewed the radiological images as listed and agreed with the findings in the report. No results found.  ASSESSMENT & PLAN:   47 y.o. male with   1) h/o Classical Hodgkins Lymphoma -Nodular sclerosis type Stage IIB  Diagnosed in January 2014 Treated with ABVD 6 cycles completed in September 2014. Patient has remained in complete remission since then.  PLAN:  -Discussed pt labwork today, 01/22/18; some borderline low ANC at 1.3k, blood chemistries are normal -Sed rate 0  -Borderline low neutropenia in the context of a recent URI, and taking aleve after multiple bee stings, not clinically worrisome -Discussed that the pt should keep an eye on his [place] with his PCP Dr. Rikki Spearing at [facility], whom he will see in 6 months -The pt completed chemotherapy 5 years ago for his Hodgkin's Lymphoma and remains in remission -The pt shows no clinical or lab progression of Hodgkin's lymphoma at this time.  -No indication for further treatment at this time. -Advised pt to receive annual flu shot and every 5 year pneumonia vaccinations with his PCP   -Will be happy to see this pt back in one year, sooner if any new concerns    2) HTN - on losartan  3) Obesity - counseling and active lifestyle and appropriate diet.   RTC with Dr Irene Limbo in 12 months with labs   All of the patients questions were answered with apparent satisfaction. The patient knows to call the clinic with any problems, questions or concerns.  The total time spent in the appt was 20 minutes and more than 50% was on counseling and direct patient cares.  Rahming Lone  MD MS AAHIVMS [facility] Hematology/Oncology Physician [facility]  (Office):       [phone] (Work cell):  [phone] (Fax):           [phone]  01/22/2018 9:13 AM  I, Baldwin Jamaica, am acting as a scribe for Dr. Irene Limbo  .I have reviewed the above documentation for accuracy and completeness, and I agree with the above. Brunetta Genera MD

## 2018-01-22 ENCOUNTER — Inpatient Hospital Stay: Payer: 59 | Attending: Hematology | Admitting: Hematology

## 2018-01-22 ENCOUNTER — Inpatient Hospital Stay: Payer: 59

## 2018-01-22 ENCOUNTER — Encounter: Payer: Self-pay | Admitting: Hematology

## 2018-01-22 VITALS — BP 118/80 | HR 72 | Temp 98.4°F | Resp 17 | Ht 72.0 in | Wt 248.8 lb

## 2018-01-22 DIAGNOSIS — Z87891 Personal history of nicotine dependence: Secondary | ICD-10-CM | POA: Insufficient documentation

## 2018-01-22 DIAGNOSIS — Z8571 Personal history of Hodgkin lymphoma: Secondary | ICD-10-CM

## 2018-01-22 DIAGNOSIS — C8118 Nodular sclerosis classical Hodgkin lymphoma, lymph nodes of multiple sites: Secondary | ICD-10-CM

## 2018-01-22 DIAGNOSIS — E669 Obesity, unspecified: Secondary | ICD-10-CM | POA: Insufficient documentation

## 2018-01-22 DIAGNOSIS — I10 Essential (primary) hypertension: Secondary | ICD-10-CM | POA: Diagnosis not present

## 2018-01-22 LAB — CMP (CANCER CENTER ONLY)
ALK PHOS: 57 U/L (ref 38–126)
ALT: 35 U/L (ref 0–44)
AST: 30 U/L (ref 15–41)
Albumin: 4.1 g/dL (ref 3.5–5.0)
Anion gap: 8 (ref 5–15)
BUN: 17 mg/dL (ref 6–20)
CALCIUM: 10 mg/dL (ref 8.9–10.3)
CO2: 23 mmol/L (ref 22–32)
CREATININE: 1 mg/dL (ref 0.61–1.24)
Chloride: 107 mmol/L (ref 98–111)
Glucose, Bld: 97 mg/dL (ref 70–99)
Potassium: 4.6 mmol/L (ref 3.5–5.1)
Sodium: 138 mmol/L (ref 135–145)
TOTAL PROTEIN: 6.9 g/dL (ref 6.5–8.1)
Total Bilirubin: 0.5 mg/dL (ref 0.3–1.2)

## 2018-01-22 LAB — CBC WITH DIFFERENTIAL (CANCER CENTER ONLY)
BASOS ABS: 0.1 10*3/uL (ref 0.0–0.1)
BASOS PCT: 3 %
EOS ABS: 0.2 10*3/uL (ref 0.0–0.5)
EOS PCT: 7 %
HCT: 43.2 % (ref 38.4–49.9)
Hemoglobin: 14.9 g/dL (ref 13.0–17.1)
LYMPHS ABS: 1.1 10*3/uL (ref 0.9–3.3)
Lymphocytes Relative: 38 %
MCH: 30.5 pg (ref 27.2–33.4)
MCHC: 34.6 g/dL (ref 32.0–36.0)
MCV: 88.3 fL (ref 79.3–98.0)
Monocytes Absolute: 0.3 10*3/uL (ref 0.1–0.9)
Monocytes Relative: 11 %
Neutro Abs: 1.3 10*3/uL — ABNORMAL LOW (ref 1.5–6.5)
Neutrophils Relative %: 41 %
PLATELETS: 169 10*3/uL (ref 140–400)
RBC: 4.9 MIL/uL (ref 4.20–5.82)
RDW: 12.9 % (ref 11.0–14.6)
WBC: 2.9 10*3/uL — AB (ref 4.0–10.3)

## 2018-01-22 LAB — RETICULOCYTES
RBC.: 4.81 MIL/uL (ref 4.20–5.82)
RETIC CT PCT: 1 % (ref 0.8–1.8)
Retic Count, Absolute: 48.1 10*3/uL (ref 34.8–93.9)

## 2018-01-22 LAB — SEDIMENTATION RATE: SED RATE: 0 mm/h (ref 0–16)

## 2019-01-20 NOTE — Progress Notes (Signed)
[name]    HEMATOLOGY/ONCOLOGY CL NOTE  Date of Service: 01/21/2019  Patient Care Team: Glenford Bayley, DO as PCP - General (Family Medicine)  CHIEF COMPLAINTS/PURPOSE OF CONSULTATION:   F/u for remote h/o Hodgkins lymphoma  HISTORY OF PRESENTING ILLNESS:   Marcus [name] is a wonderful 48 y.o. male who has been referred to [place] by Dr .Marin Comment, Tilden Fossa, DO for evaluation and continued management of Hodgkin's lymphoma.  Patient has previously been seen by Dr. Nicki Guadalajara MD at [facility] in [facility] for management of his Hodgkin's [place] and recently moved to [facility] in January 2018 and wanted to continue his follow-up for Hodgkin's lymphoma here.  Patient presented with bilateral cervical lymphadenopathy first noted in December 2013 and then evaluated with a CT of the neck in January 2014. He notes that he also had some night sweats and some fatigue along with the lymphadenopathy. Patient had a lymph node biopsy of the neck initially which was nondiagnostic.    patient had a PET CT scan on 07/11/2012 which showed extensive nodal hypermetabolic lesion throughout the outer neck bilaterally and mediastinum with SUV up to 11.6   Patient required 2 biopsies of his mediastinal nodes by interventional radiology to make a diagnosis of Hodgkin's lymphoma nodular sclerosis type in March 2014.   patient was treated with ABVD for 6 cycles February through September 2014  He has been in complete remission since then. His last clinic visit with medical oncology was in August 2017 when he had no clinical or lab findings suggestive of disease recurrence. He missed a follow-up in February 2018 since he was in the process of moving to [place].  Patient notes that he is enjoying his job in [place]. He notes no overt new enlarged lymph nodes. No fevers no chills no night sweats no unexpected weight loss. No chest pain no shortness of breath. No abdominal pain or distention. No new skin  rashes.  Interval History:  Marcus [name] returns today regarding his history of Hodgkin's Lymphoma. The patient's last visit with [place] was on 07/25/2017. The pt reports that he is doing well overall.  The pt reports no new concerns or infection issues. His extremity numbness is not persistent and he notices it more in the morning. He has been taking Losartan. Pt took one of his wife's Meloxicam a couple of days ago.  Lab results today (01/21/19) of CBC w/diff and CMP is as follows: all values are WNL except for WBC at 3.7K, Neutro Abs at 1.6K. 01/21/2019 Sed rate is at 2.   On review of systems, pt reports numbness in his fingers and toes and denies fevers, chills, night sweats, urinary issues, abdominal pain, new lumps/bumps, mouth sores and any other symptoms.    MEDICAL HISTORY:  #1 history of Hodgkin's lymphoma diagnosed in April 2014 status post ABVD 6 cycles completed in September 2014. Has remained in complete remission since then. #2 obesity #3 hypertension  SURGICAL HISTORY: Past Surgical History:  Procedure Laterality Date  . ganglion cyst removal      SOCIAL HISTORY: Social History   Socioeconomic History  . Marital status: Married    Spouse name: Not on file  . Number of children: 1  . Years of education: Not on file  . Highest education level: Not on file  Occupational History  . Not on file  Social Needs  . Financial resource strain: Not on file  . Food insecurity    Worry: Not  on file    Inability: Not on file  . Transportation needs    Medical: Not on file    Non-medical: Not on file  Tobacco Use  . Smoking status: Former Smoker    Quit date: 2011    Years since quitting: 9.6  . Smokeless tobacco: Never Used  Substance and Sexual Activity  . Alcohol use: Yes    Alcohol/week: 14.0 standard drinks    Types: 14 Cans of beer per week  . Drug use: No  . Sexual activity: Not on file  Lifestyle  . Physical activity    Days per week: Not on file     Minutes per session: Not on file  . Stress: Not on file  Relationships  . Social Herbalist on phone: Not on file    Gets together: Not on file    Attends religious service: Not on file    Active member of club or organization: Not on file    Attends meetings of clubs or organizations: Not on file    Relationship status: Not on file  . Intimate partner violence    Fear of current or ex partner: Not on file    Emotionally abused: Not on file    Physically abused: Not on file    Forced sexual activity: Not on file  Other Topics Concern  . Not on file  Social History Narrative  . 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 Remote history of smoking quit in 2011. Social alcohol use Denies any other drug use  FAMILY HISTORY:  Father had nephrectomy for kidney cancer in 60 Mother had non-Hodgkin's lymphoma followed by Hodgkin's lymphoma and eventually died from adenocarcinoma of unknown primary.   ALLERGIES:  has No Known Allergies.  MEDICATIONS:  Current Outpatient Medications  Medication Sig Dispense Refill  . losartan (COZAAR) 25 MG tablet Take 25 mg by mouth daily.  5  . metoprolol tartrate (LOPRESSOR) 50 MG tablet Take 50 mg 1 hour before cardiac ct (Patient not taking: Reported on 01/22/2018) 1 tablet 0  . SF 5000 PLUS 1.1 % CREA dental cream Take 1 application by mouth daily.  0   No current facility-administered medications for this visit.     REVIEW OF SYSTEMS:    A 10+ POINT REVIEW OF SYSTEMS WAS OBTAINED including neurology, dermatology, psychiatry, cardiac, respiratory, lymph, extremities, GI, GU, Musculoskeletal, constitutional, breasts, reproductive, HEENT.  All pertinent positives are noted in the HPI.  All others are negative.   PHYSICAL EXAMINATION: ECOG PERFORMANCE STATUS: 0 - Asymptomatic  . Vitals:   01/21/19 0853  BP: 123/82   Pulse: 76  Resp: 18  Temp: 97.8 F (36.6 C)  SpO2: 99%   Filed Weights   01/21/19 0853  Weight: 242 lb 11.2 oz (110.1 kg)   .Body mass index is 32.92 kg/m.  GENERAL:alert, in no acute distress and comfortable SKIN: no acute rashes, no significant lesions EYES: conjunctiva are pink and non-injected, sclera anicteric OROPHARYNX: MMM, no exudates, no oropharyngeal erythema or ulceration NECK: supple, no JVD LYMPH:  no palpable lymphadenopathy in the cervical, axillary or inguinal regions LUNGS: clear to auscultation b/l with normal respiratory effort HEART: regular rate & rhythm ABDOMEN:  normoactive bowel sounds , non tender, not distended. No palpable hepatosplenomegaly. Extremity: no pedal edema PSYCH: alert & oriented x 3 with fluent speech NEURO: no focal motor/sensory  deficits  LABORATORY DATA:  I have reviewed the data as listed  . CBC Latest Ref Rng & Units 01/21/2019 01/22/2018 07/25/2017  WBC 4.0 - 10.5 K/uL 3.7(L) 2.9(L) 3.3(L)  Hemoglobin 13.0 - 17.0 g/dL 15.1 14.9 15.0  Hematocrit 39.0 - 52.0 % 44.5 43.2 43.8  Platelets 150 - 400 K/uL 209 169 170   . CBC    Component Value Date/Time   WBC 3.7 (L) 01/21/2019 0825   RBC 4.99 01/21/2019 0825   HGB 15.1 01/21/2019 0825   HGB 14.9 01/22/2018 0744   HCT 44.5 01/21/2019 0825   PLT 209 01/21/2019 0825   PLT 169 01/22/2018 0744   MCV 89.2 01/21/2019 0825   MCH 30.3 01/21/2019 0825   MCHC 33.9 01/21/2019 0825   RDW 12.2 01/21/2019 0825   LYMPHSABS 1.3 01/21/2019 0825   MONOABS 0.4 01/21/2019 0825   EOSABS 0.3 01/21/2019 0825   BASOSABS 0.1 01/21/2019 0825   . CMP Latest Ref Rng & Units 01/21/2019 01/22/2018 07/25/2017  Glucose 70 - 99 mg/dL 96 97 93  BUN 6 - 20 mg/dL 15 17 16   Creatinine 0.61 - 1.24 mg/dL 0.97 1.00 1.01  Sodium 135 - 145 mmol/L 138 138 138  Potassium 3.5 - 5.1 mmol/L 4.5 4.6 4.8  Chloride 98 - 111 mmol/L 106 107 105  CO2 22 - 32 mmol/L 22 23 25   Calcium 8.9 - 10.3 mg/dL 10.1 10.0 10.4   Total Protein 6.5 - 8.1 g/dL 7.0 6.9 6.8  Total Bilirubin 0.3 - 1.2 mg/dL 0.6 0.5 0.7  Alkaline Phos 38 - 126 U/L 70 57 65  AST 15 - 41 U/L 24 30 24   ALT 0 - 44 U/L 31 35 27   Sed rate 0   RADIOGRAPHIC STUDIES: I have personally reviewed the radiological images as listed and agreed with the findings in the report. No results found.  ASSESSMENT & PLAN:   48 y.o. male with   1) h/o Classical Hodgkins Lymphoma -Nodular sclerosis type Stage IIB  Diagnosed in January 2014 Treated with ABVD 6 cycles completed in September 2014. Patient has remained in complete remission since then.  PLAN: A&P: -Discussed pt labwork today, 01/21/19; all values are WNL except for WBC at 3.7K, Neutro Abs at 1.6K. -Discussed 01/21/2019 Sed rate at 2. -Encouraged f/u with PCP to Dx causes of neuropathy.   -Recommend Vitamin B-complex supplement for nerve health. -Recommended getting Prevnar and Pneumovax with PCP. -Recommended getting flu vaccine when it becomes available. -The pt completed chemotherapy 5 years ago for his Hodgkin's Lymphoma and remains in remission -The pt shows no clinical or lab progression of Hodgkin's lymphoma at this time.  -No indication for further treatment at this time. -Will be happy to see this pt back in one year, sooner if any new concerns    2) HTN - on losartan  3) Obesity - counseling and active lifestyle and appropriate diet.  FOLLOW UP:  RTC with dr Irene Limbo with labs in 12 months  The total time spent in the appt was 15 minutes and more than 50% was on counseling and direct patient cares.  All of the patient's questions were answered with apparent satisfaction. The patient knows to call the clinic with any problems, questions or concerns.   Bustamante Lone MD [place] AAHIVMS [facility] Hematology/Oncology Physician [facility]  (Office):       [phone] (Work cell):  [phone] (Fax):           [phone]  01/21/2019  9:55 AM  I, Yevette Edwards,  am acting as a scribe for Dr. Borchers Lone.   .I have reviewed the above documentation for accuracy and completeness, and I agree with the above. Brunetta Genera MD

## 2019-01-21 ENCOUNTER — Inpatient Hospital Stay: Payer: BC Managed Care – PPO

## 2019-01-21 ENCOUNTER — Telehealth: Payer: Self-pay | Admitting: Hematology

## 2019-01-21 ENCOUNTER — Other Ambulatory Visit: Payer: Self-pay

## 2019-01-21 ENCOUNTER — Inpatient Hospital Stay: Payer: BC Managed Care – PPO | Attending: Hematology | Admitting: Hematology

## 2019-01-21 VITALS — BP 123/82 | HR 76 | Temp 97.8°F | Resp 18 | Ht 72.0 in | Wt 242.7 lb

## 2019-01-21 DIAGNOSIS — C8118 Nodular sclerosis classical Hodgkin lymphoma, lymph nodes of multiple sites: Secondary | ICD-10-CM | POA: Diagnosis not present

## 2019-01-21 DIAGNOSIS — Z87891 Personal history of nicotine dependence: Secondary | ICD-10-CM | POA: Insufficient documentation

## 2019-01-21 DIAGNOSIS — I10 Essential (primary) hypertension: Secondary | ICD-10-CM | POA: Diagnosis not present

## 2019-01-21 DIAGNOSIS — E669 Obesity, unspecified: Secondary | ICD-10-CM | POA: Insufficient documentation

## 2019-01-21 DIAGNOSIS — Z8571 Personal history of Hodgkin lymphoma: Secondary | ICD-10-CM | POA: Insufficient documentation

## 2019-01-21 LAB — CMP (CANCER CENTER ONLY)
ALT: 31 U/L (ref 0–44)
AST: 24 U/L (ref 15–41)
Albumin: 4.1 g/dL (ref 3.5–5.0)
Alkaline Phosphatase: 70 U/L (ref 38–126)
Anion gap: 10 (ref 5–15)
BUN: 15 mg/dL (ref 6–20)
CO2: 22 mmol/L (ref 22–32)
Calcium: 10.1 mg/dL (ref 8.9–10.3)
Chloride: 106 mmol/L (ref 98–111)
Creatinine: 0.97 mg/dL (ref 0.61–1.24)
GFR, Est AFR Am: >60 mL/min (ref >60)
GFR, Estimated: >60 mL/min (ref >60)
Glucose, Bld: 96 mg/dL (ref 70–99)
Potassium: 4.5 mmol/L (ref 3.5–5.1)
Sodium: 138 mmol/L (ref 135–145)
Total Bilirubin: 0.6 mg/dL (ref 0.3–1.2)
Total Protein: 7 g/dL (ref 6.5–8.1)

## 2019-01-21 LAB — CBC WITH DIFFERENTIAL/PLATELET
Abs Immature Granulocytes: 0.02 10*3/uL (ref 0.00–0.07)
Basophils Absolute: 0.1 10*3/uL (ref 0.0–0.1)
Basophils Relative: 2 %
Eosinophils Absolute: 0.3 10*3/uL (ref 0.0–0.5)
Eosinophils Relative: 7 %
HCT: 44.5 % (ref 39.0–52.0)
Hemoglobin: 15.1 g/dL (ref 13.0–17.0)
Immature Granulocytes: 1 %
Lymphocytes Relative: 35 %
Lymphs Abs: 1.3 10*3/uL (ref 0.7–4.0)
MCH: 30.3 pg (ref 26.0–34.0)
MCHC: 33.9 g/dL (ref 30.0–36.0)
MCV: 89.2 fL (ref 80.0–100.0)
Monocytes Absolute: 0.4 10*3/uL (ref 0.1–1.0)
Monocytes Relative: 11 %
Neutro Abs: 1.6 10*3/uL — ABNORMAL LOW (ref 1.7–7.7)
Neutrophils Relative %: 44 %
Platelets: 209 10*3/uL (ref 150–400)
RBC: 4.99 MIL/uL (ref 4.22–5.81)
RDW: 12.2 % (ref 11.5–15.5)
WBC: 3.7 10*3/uL — ABNORMAL LOW (ref 4.0–10.5)
nRBC: 0 % (ref 0.0–0.2)

## 2019-01-21 LAB — SEDIMENTATION RATE: Sed Rate: 2 mm/hr (ref 0–16)

## 2019-01-21 NOTE — Telephone Encounter (Signed)
Scheduled appt per 8/25 los. ° °Printed calendar and avs. °

## 2019-03-03 DIAGNOSIS — I10 Essential (primary) hypertension: Secondary | ICD-10-CM | POA: Diagnosis not present

## 2019-03-03 DIAGNOSIS — Z23 Encounter for immunization: Secondary | ICD-10-CM | POA: Diagnosis not present

## 2019-06-16 DIAGNOSIS — Z20828 Contact with and (suspected) exposure to other viral communicable diseases: Secondary | ICD-10-CM | POA: Diagnosis not present

## 2019-08-13 DIAGNOSIS — Z20828 Contact with and (suspected) exposure to other viral communicable diseases: Secondary | ICD-10-CM | POA: Diagnosis not present

## 2020-01-20 NOTE — Progress Notes (Signed)
[name]    HEMATOLOGY/ONCOLOGY CL NOTE  Date of Service: 01/21/2020  Patient Care Team: Glenford Bayley, DO as PCP - General (Family Medicine)  CHIEF COMPLAINTS/PURPOSE OF CONSULTATION:   F/u for remote h/o Hodgkins lymphoma  HISTORY OF PRESENTING ILLNESS:   Marcus [name] is a wonderful 49 y.o. male who has been referred to [place] by Dr .Marin Comment, Tilden Fossa, DO for evaluation and continued management of Hodgkin's lymphoma.  Patient has previously been seen by Dr. Nicki Guadalajara MD at [facility] in [facility] for management of his Hodgkin's [place] and recently moved to [facility] in January 2018 and wanted to continue his follow-up for Hodgkin's lymphoma here.  Patient presented with bilateral cervical lymphadenopathy first noted in December 2013 and then evaluated with a CT of the neck in January 2014. He notes that he also had some night sweats and some fatigue along with the lymphadenopathy. Patient had a lymph node biopsy of the neck initially which was nondiagnostic.    patient had a PET CT scan on 07/11/2012 which showed extensive nodal hypermetabolic lesion throughout the outer neck bilaterally and mediastinum with SUV up to 11.6   Patient required 2 biopsies of his mediastinal nodes by interventional radiology to make a diagnosis of Hodgkin's lymphoma nodular sclerosis type in March 2014.   patient was treated with ABVD for 6 cycles February through September 2014  He has been in complete remission since then. His last clinic visit with medical oncology was in August 2017 when he had no clinical or lab findings suggestive of disease recurrence. He missed a follow-up in February 2018 since he was in the process of moving to [place].  Patient notes that he is enjoying his job in [place]. He notes no overt new enlarged lymph nodes. No fevers no chills no night sweats no unexpected weight loss. No chest pain no shortness of breath. No abdominal pain or distention. No new skin  rashes.  Interval History:  Marcus [name] returns today regarding his history of Hodgkin's Lymphoma. The patient's last visit with [place] was on 01/21/2019. The pt reports that he is doing well overall.  The pt reports that he has had difficulty clearing his throat and has a dry cough and itchy throat. These symptoms mostly occur after he eats and has been intermittent for the last few months. Pt endorses some acid reflux.   He ran out of Losartan a few months ago and has not refilled this prescription.   Pt recently lost his father.   Lab results today (01/21/20) of CBC w/diff and CMP is as follows: all values are WNL except for WBC at 3.4K, Calcium at 11.2. 01/21/2020 Sed rate at 3  On review of systems, pt reports reflux, dry cough, throat itching and denies difficulty swallowing, unexpected weight loss, fevers, chills, night sweats, leg swelling, abdominal pain, SOB, back pain, vocal changes and any other symptoms.   MEDICAL HISTORY:  #1 history of Hodgkin's lymphoma diagnosed in April 2014 status post ABVD 6 cycles completed in September 2014. Has remained in complete remission since then. #2 obesity #3 hypertension  SURGICAL HISTORY: Past Surgical History:  Procedure Laterality Date  . ganglion cyst removal      SOCIAL HISTORY: Social History   Socioeconomic History  . Marital status: Married    Spouse name: Not on file  . Number of children: 1  . Years of education: Not on file  . Highest education level: Not on file  Occupational  History  . Not on file  Tobacco Use  . Smoking status: Former Smoker    Quit date: 2011    Years since quitting: 10.6  . Smokeless tobacco: Never Used  Vaping Use  . Vaping Use: Never used  Substance and Sexual Activity  . Alcohol use: Yes    Alcohol/week: 14.0 standard drinks    Types: 14 Cans of beer per week  . Drug use: No  . Sexual activity: Not on file  Other Topics Concern  . Not on file  Social History Narrative  . Not  on file   Social Determinants of Health   Financial Resource Strain:   . Difficulty of Paying Living Expenses: Not on file  Food Insecurity:   . Worried About Charity fundraiser in the Last Year: Not on file  . Ran Out of Food in the Last Year: Not on file  Transportation Needs:   . Lack of Transportation (Medical): Not on file  . Lack of Transportation (Non-Medical): Not on file  Physical Activity:   . Days of Exercise per Week: Not on file  . Minutes of Exercise per Session: Not on file  Stress:   . Feeling of Stress : Not on file  Social Connections:   . Frequency of Communication with Friends and Family: Not on file  . Frequency of Social Gatherings with Friends and Family: Not on file  . Attends Religious Services: Not on file  . Active Member of Clubs or Organizations: Not on file  . Attends Archivist Meetings: Not on file  . Marital Status: Not on file  Intimate Partner Violence:   . Fear of Current or Ex-Partner: Not on file  . Emotionally Abused: Not on file  . Physically Abused: Not on file  . Sexually Abused: Not on file  Works as a Optician, dispensing. Married. Has one son Recently moved to [facility] from [place] in January 2018. Remote history of smoking quit in 2011. Social alcohol use Denies any other drug use  FAMILY HISTORY:  Father had nephrectomy for kidney cancer in 26 Mother had non-Hodgkin's lymphoma followed by Hodgkin's lymphoma and eventually died from adenocarcinoma of unknown primary.   ALLERGIES:  has No Known Allergies.  MEDICATIONS:  No current outpatient medications on file.   No current facility-administered medications for this visit.    REVIEW OF SYSTEMS:   A 10+ POINT REVIEW OF SYSTEMS WAS OBTAINED including neurology, dermatology, psychiatry, cardiac, respiratory, lymph, extremities, GI, GU, Musculoskeletal, constitutional, breasts, reproductive, HEENT.  All pertinent positives are  noted in the HPI.  All others are negative.   PHYSICAL EXAMINATION: ECOG PERFORMANCE STATUS: 0 - Asymptomatic  . Vitals:   01/21/20 0907  BP: 137/85  Pulse: 88  Resp: 18  Temp: (!) 96.9 F (36.1 C)  SpO2: 98%   Filed Weights   01/21/20 0907  Weight: 256 lb 6.4 oz (116.3 kg)   .Body mass index is 34.77 kg/m.   GENERAL:alert, in no acute distress and comfortable SKIN: no acute rashes, no significant lesions EYES: conjunctiva are pink and non-injected, sclera anicteric OROPHARYNX: MMM, no exudates, no oropharyngeal erythema or ulceration NECK: supple, no JVD LYMPH:  no palpable lymphadenopathy in the cervical, axillary or inguinal regions LUNGS: clear to auscultation b/l with normal respiratory effort HEART: regular rate & rhythm ABDOMEN:  normoactive bowel sounds , non tender, not distended. No palpable hepatosplenomegaly.  Extremity: no pedal edema PSYCH: alert & oriented x 3 with  fluent speech NEURO: no focal motor/sensory deficits  LABORATORY DATA:  I have reviewed the data as listed  . CBC Latest Ref Rng & Units 01/21/2020 01/21/2019 01/22/2018  WBC 4.0 - 10.5 K/uL 3.4(L) 3.7(L) 2.9(L)  Hemoglobin 13.0 - 17.0 g/dL 15.0 15.1 14.9  Hematocrit 39 - 52 % 43.2 44.5 43.2  Platelets 150 - 400 K/uL 195 209 169   . CBC    Component Value Date/Time   WBC 3.4 (L) 01/21/2020 0833   RBC 4.94 01/21/2020 0833   HGB 15.0 01/21/2020 0833   HGB 14.9 01/22/2018 0744   HCT 43.2 01/21/2020 0833   PLT 195 01/21/2020 0833   PLT 169 01/22/2018 0744   MCV 87.4 01/21/2020 0833   MCH 30.4 01/21/2020 0833   MCHC 34.7 01/21/2020 0833   RDW 12.3 01/21/2020 0833   LYMPHSABS 1.0 01/21/2020 0833   MONOABS 0.4 01/21/2020 0833   EOSABS 0.2 01/21/2020 0833   BASOSABS 0.0 01/21/2020 0833   . CMP Latest Ref Rng & Units 01/21/2020 01/21/2019 01/22/2018  Glucose 70 - 99 mg/dL 95 96 97  BUN 6 - 20 mg/dL 15 15 17   Creatinine 0.61 - 1.24 mg/dL 1.09 0.97 1.00  Sodium 135 - 145 mmol/L 140 138  138  Potassium 3.5 - 5.1 mmol/L 4.4 4.5 4.6  Chloride 98 - 111 mmol/L 106 106 107  CO2 22 - 32 mmol/L 26 22 23   Calcium 8.9 - 10.3 mg/dL 11.2(H) 10.1 10.0  Total Protein 6.5 - 8.1 g/dL 7.0 7.0 6.9  Total Bilirubin 0.3 - 1.2 mg/dL 0.6 0.6 0.5  Alkaline Phos 38 - 126 U/L 71 70 57  AST 15 - 41 U/L 27 24 30   ALT 0 - 44 U/L 42 31 35   Sed rate 0   RADIOGRAPHIC STUDIES: I have personally reviewed the radiological images as listed and agreed with the findings in the report. No results found.  ASSESSMENT & PLAN:   49 y.o. male with   1) h/o Classical Hodgkins Lymphoma -Nodular sclerosis type Stage IIB  Diagnosed in January 2014 Treated with ABVD 6 cycles completed in September 2014. Patient has remained in complete remission since then.  PLAN: -Discussed pt labwork today, 01/21/20; blood counts look good, hypercalcemia, other blood chemistries are nml, Sed rate is WNL -No lab or clinical evidence of Hodgkin's progression at this time. Pt is seven years out from treatment. Will continue watchful observation.  -Recommend pt monitor BP in the morning, prior to food or activity. Recommend pt f/u with PCP for Losartan management.  -Advised pt that his hypercalcemia could be from dehydration or increased calcium intake. It is high enough to warrant a w/o at this time.  -Recommend pt hydrate well - 48-64 oz of water per day.  -Advised pt that dry cough is a common side effect of reflux.  -Will refer pt to ENT, Dr. Lucia Gaskins in 2 weeks -Will get additional labs in 1 week with CT scan -Will get CT Neck/Chest in 1 week -Will see back in 3 weeks via phone   2) HTN - on losartan  3) Obesity - counseling and active lifestyle and appropriate diet.  4) Hypercalcemia - w/u ordered  FOLLOW UP: ENT referral to Dr Lucia Gaskins in 2 weeks CT neck/chest in 1 week Labs in 1 week Phone visit with Dr Irene Limbo in 3 weeks   The total time spent in the appt was 30 minutes and more than 50% was on counseling and  direct patient cares.  All  of the patient's questions were answered with apparent satisfaction. The patient knows to call the clinic with any problems, questions or concerns.   Parzych Lone MD [place] AAHIVMS [facility] Hematology/Oncology Physician [facility]  (Office):       [phone] (Work cell):  [phone] (Fax):           [phone]  01/21/2020 10:06 AM  I, Yevette Edwards, am acting as a scribe for Dr. Yzaguirre Lone.   .I have reviewed the above documentation for accuracy and completeness, and I agree with the above. Brunetta Genera MD

## 2020-01-21 ENCOUNTER — Other Ambulatory Visit: Payer: BC Managed Care – PPO

## 2020-01-21 ENCOUNTER — Ambulatory Visit: Payer: BC Managed Care – PPO | Admitting: Hematology

## 2020-01-21 ENCOUNTER — Inpatient Hospital Stay: Payer: BC Managed Care – PPO | Attending: Hematology

## 2020-01-21 ENCOUNTER — Other Ambulatory Visit: Payer: Self-pay

## 2020-01-21 ENCOUNTER — Inpatient Hospital Stay: Payer: BC Managed Care – PPO | Admitting: Hematology

## 2020-01-21 VITALS — BP 137/85 | HR 88 | Temp 96.9°F | Resp 18 | Ht 72.0 in | Wt 256.4 lb

## 2020-01-21 DIAGNOSIS — C8118 Nodular sclerosis classical Hodgkin lymphoma, lymph nodes of multiple sites: Secondary | ICD-10-CM

## 2020-01-21 DIAGNOSIS — E669 Obesity, unspecified: Secondary | ICD-10-CM | POA: Insufficient documentation

## 2020-01-21 DIAGNOSIS — Z87891 Personal history of nicotine dependence: Secondary | ICD-10-CM | POA: Insufficient documentation

## 2020-01-21 DIAGNOSIS — Z8571 Personal history of Hodgkin lymphoma: Secondary | ICD-10-CM | POA: Diagnosis not present

## 2020-01-21 DIAGNOSIS — Z20828 Contact with and (suspected) exposure to other viral communicable diseases: Secondary | ICD-10-CM | POA: Diagnosis not present

## 2020-01-21 DIAGNOSIS — R05 Cough: Secondary | ICD-10-CM | POA: Diagnosis not present

## 2020-01-21 DIAGNOSIS — I10 Essential (primary) hypertension: Secondary | ICD-10-CM | POA: Insufficient documentation

## 2020-01-21 DIAGNOSIS — R053 Chronic cough: Secondary | ICD-10-CM

## 2020-01-21 LAB — CMP (CANCER CENTER ONLY)
ALT: 42 U/L (ref 0–44)
AST: 27 U/L (ref 15–41)
Albumin: 4 g/dL (ref 3.5–5.0)
Alkaline Phosphatase: 71 U/L (ref 38–126)
Anion gap: 8 (ref 5–15)
BUN: 15 mg/dL (ref 6–20)
CO2: 26 mmol/L (ref 22–32)
Calcium: 11.2 mg/dL — ABNORMAL HIGH (ref 8.9–10.3)
Chloride: 106 mmol/L (ref 98–111)
Creatinine: 1.09 mg/dL (ref 0.61–1.24)
GFR, Est AFR Am: >60 mL/min (ref >60)
GFR, Estimated: >60 mL/min (ref >60)
Glucose, Bld: 95 mg/dL (ref 70–99)
Potassium: 4.4 mmol/L (ref 3.5–5.1)
Sodium: 140 mmol/L (ref 135–145)
Total Bilirubin: 0.6 mg/dL (ref 0.3–1.2)
Total Protein: 7 g/dL (ref 6.5–8.1)

## 2020-01-21 LAB — CBC WITH DIFFERENTIAL/PLATELET
Abs Immature Granulocytes: 0.01 10*3/uL (ref 0.00–0.07)
Basophils Absolute: 0 10*3/uL (ref 0.0–0.1)
Basophils Relative: 1 %
Eosinophils Absolute: 0.2 10*3/uL (ref 0.0–0.5)
Eosinophils Relative: 6 %
HCT: 43.2 % (ref 39.0–52.0)
Hemoglobin: 15 g/dL (ref 13.0–17.0)
Immature Granulocytes: 0 %
Lymphocytes Relative: 28 %
Lymphs Abs: 1 10*3/uL (ref 0.7–4.0)
MCH: 30.4 pg (ref 26.0–34.0)
MCHC: 34.7 g/dL (ref 30.0–36.0)
MCV: 87.4 fL (ref 80.0–100.0)
Monocytes Absolute: 0.4 10*3/uL (ref 0.1–1.0)
Monocytes Relative: 12 %
Neutro Abs: 1.8 10*3/uL (ref 1.7–7.7)
Neutrophils Relative %: 53 %
Platelets: 195 10*3/uL (ref 150–400)
RBC: 4.94 MIL/uL (ref 4.22–5.81)
RDW: 12.3 % (ref 11.5–15.5)
WBC: 3.4 10*3/uL — ABNORMAL LOW (ref 4.0–10.5)
nRBC: 0 % (ref 0.0–0.2)

## 2020-01-21 LAB — SEDIMENTATION RATE: Sed Rate: 3 mm/hr (ref 0–16)

## 2020-01-28 ENCOUNTER — Other Ambulatory Visit: Payer: Self-pay

## 2020-01-28 ENCOUNTER — Inpatient Hospital Stay: Payer: BC Managed Care – PPO | Attending: Hematology

## 2020-01-28 DIAGNOSIS — Z87891 Personal history of nicotine dependence: Secondary | ICD-10-CM | POA: Insufficient documentation

## 2020-01-28 DIAGNOSIS — Z8571 Personal history of Hodgkin lymphoma: Secondary | ICD-10-CM | POA: Diagnosis not present

## 2020-01-28 DIAGNOSIS — I10 Essential (primary) hypertension: Secondary | ICD-10-CM | POA: Insufficient documentation

## 2020-01-28 DIAGNOSIS — R053 Chronic cough: Secondary | ICD-10-CM

## 2020-01-28 DIAGNOSIS — C8118 Nodular sclerosis classical Hodgkin lymphoma, lymph nodes of multiple sites: Secondary | ICD-10-CM

## 2020-01-28 LAB — CMP (CANCER CENTER ONLY)
ALT: 34 U/L (ref 0–44)
AST: 27 U/L (ref 15–41)
Albumin: 4.1 g/dL (ref 3.5–5.0)
Alkaline Phosphatase: 68 U/L (ref 38–126)
Anion gap: 9 (ref 5–15)
BUN: 14 mg/dL (ref 6–20)
CO2: 23 mmol/L (ref 22–32)
Calcium: 11.4 mg/dL — ABNORMAL HIGH (ref 8.9–10.3)
Chloride: 105 mmol/L (ref 98–111)
Creatinine: 0.98 mg/dL (ref 0.61–1.24)
GFR, Est AFR Am: >60 mL/min
GFR, Estimated: >60 mL/min
Glucose, Bld: 101 mg/dL — ABNORMAL HIGH (ref 70–99)
Potassium: 4.3 mmol/L (ref 3.5–5.1)
Sodium: 137 mmol/L (ref 135–145)
Total Bilirubin: 0.6 mg/dL (ref 0.3–1.2)
Total Protein: 7 g/dL (ref 6.5–8.1)

## 2020-01-29 LAB — PTH, INTACT AND CALCIUM
Calcium, Total (PTH): 10.7 mg/dL — ABNORMAL HIGH (ref 8.7–10.2)
PTH: 49 pg/mL (ref 15–65)

## 2020-01-29 LAB — CALCIUM, IONIZED: Calcium, Ionized, Serum: 5.8 mg/dL — ABNORMAL HIGH (ref 4.5–5.6)

## 2020-01-30 LAB — MULTIPLE MYELOMA PANEL, SERUM
Albumin SerPl Elph-Mcnc: 3.7 g/dL (ref 2.9–4.4)
Albumin/Glob SerPl: 1.4 (ref 0.7–1.7)
Alpha 1: 0.2 g/dL (ref 0.0–0.4)
Alpha2 Glob SerPl Elph-Mcnc: 0.6 g/dL (ref 0.4–1.0)
B-Globulin SerPl Elph-Mcnc: 1.1 g/dL (ref 0.7–1.3)
Gamma Glob SerPl Elph-Mcnc: 0.8 g/dL (ref 0.4–1.8)
Globulin, Total: 2.7 g/dL (ref 2.2–3.9)
IgA: 291 mg/dL (ref 90–386)
IgG (Immunoglobin G), Serum: 752 mg/dL (ref 603–1613)
IgM (Immunoglobulin M), Srm: 106 mg/dL (ref 20–172)
Total Protein ELP: 6.4 g/dL (ref 6.0–8.5)

## 2020-02-03 ENCOUNTER — Ambulatory Visit (HOSPITAL_COMMUNITY): Payer: BC Managed Care – PPO

## 2020-02-04 ENCOUNTER — Ambulatory Visit (HOSPITAL_COMMUNITY)
Admission: RE | Admit: 2020-02-04 | Discharge: 2020-02-04 | Disposition: A | Discharge status: Home / Self Care | Payer: BC Managed Care – PPO | Source: Ambulatory Visit | Attending: Hematology | Admitting: Hematology

## 2020-02-04 ENCOUNTER — Other Ambulatory Visit: Payer: Self-pay

## 2020-02-04 DIAGNOSIS — R05 Cough: Secondary | ICD-10-CM | POA: Diagnosis not present

## 2020-02-04 DIAGNOSIS — R053 Chronic cough: Secondary | ICD-10-CM

## 2020-02-04 DIAGNOSIS — C8118 Nodular sclerosis classical Hodgkin lymphoma, lymph nodes of multiple sites: Secondary | ICD-10-CM | POA: Insufficient documentation

## 2020-02-04 DIAGNOSIS — C8190 Hodgkin lymphoma, unspecified, unspecified site: Secondary | ICD-10-CM | POA: Diagnosis not present

## 2020-02-04 DIAGNOSIS — I700 Atherosclerosis of aorta: Secondary | ICD-10-CM | POA: Diagnosis not present

## 2020-02-04 DIAGNOSIS — C8590 Non-Hodgkin lymphoma, unspecified, unspecified site: Secondary | ICD-10-CM | POA: Diagnosis not present

## 2020-02-04 DIAGNOSIS — J9859 Other diseases of mediastinum, not elsewhere classified: Secondary | ICD-10-CM | POA: Diagnosis not present

## 2020-02-04 MED ORDER — IOHEXOL 300 MG/ML  SOLN
75.0000 mL | Freq: Once | INTRAMUSCULAR | Status: AC | PRN
  Administered 2020-02-04: 75 mL via INTRAVENOUS

## 2020-02-06 ENCOUNTER — Telehealth: Payer: Self-pay | Admitting: Hematology

## 2020-02-06 NOTE — Telephone Encounter (Signed)
Called patient to confirm 9/13 appointment. Patient is aware of appointment and that it is a phone visit.

## 2020-02-07 LAB — PTH-RELATED PEPTIDE: PTH-related peptide: <2 pmol/L

## 2020-02-09 ENCOUNTER — Telehealth: Payer: Self-pay | Admitting: Hematology

## 2020-02-09 ENCOUNTER — Inpatient Hospital Stay (HOSPITAL_BASED_OUTPATIENT_CLINIC_OR_DEPARTMENT_OTHER): Payer: BC Managed Care – PPO | Admitting: Hematology

## 2020-02-09 DIAGNOSIS — C8118 Nodular sclerosis classical Hodgkin lymphoma, lymph nodes of multiple sites: Secondary | ICD-10-CM

## 2020-02-09 NOTE — Progress Notes (Signed)
[name]    HEMATOLOGY/ONCOLOGY CL NOTE  Date of Service: 02/09/2020  Patient Care Team: Glenford Bayley, DO as PCP - General (Family Medicine)  CHIEF COMPLAINTS/PURPOSE OF CONSULTATION:   F/u for remote h/o Hodgkins lymphoma  HISTORY OF PRESENTING ILLNESS:   Marcus [name] is a wonderful 49 y.o. male who has been referred to [place] by Dr .Marin Comment, Tilden Fossa, DO for evaluation and continued management of Hodgkin's lymphoma.  Patient has previously been seen by Dr. Nicki Guadalajara MD at [facility] in [facility] for management of his Hodgkin's [place] and recently moved to [facility] in January 2018 and wanted to continue his follow-up for Hodgkin's lymphoma here.  Patient presented with bilateral cervical lymphadenopathy first noted in December 2013 and then evaluated with a CT of the neck in January 2014. He notes that he also had some night sweats and some fatigue along with the lymphadenopathy. Patient had a lymph node biopsy of the neck initially which was nondiagnostic.    patient had a PET CT scan on 07/11/2012 which showed extensive nodal hypermetabolic lesion throughout the outer neck bilaterally and mediastinum with SUV up to 11.6   Patient required 2 biopsies of his mediastinal nodes by interventional radiology to make a diagnosis of Hodgkin's lymphoma nodular sclerosis type in March 2014.   patient was treated with ABVD for 6 cycles February through September 2014  He has been in complete remission since then. His last clinic visit with medical oncology was in August 2017 when he had no clinical or lab findings suggestive of disease recurrence. He missed a follow-up in February 2018 since he was in the process of moving to [place].  Patient notes that he is enjoying his job in [place]. He notes no overt new enlarged lymph nodes. No fevers no chills no night sweats no unexpected weight loss. No chest pain no shortness of breath. No abdominal pain or distention. No new skin  rashes.  Interval History: I connected with  Myrle Sheng on 02/09/20 by telephone and verified that I am speaking with the correct person using two identifiers.   I discussed the limitations of evaluation and management by telemedicine. The patient expressed understanding and agreed to proceed.  Other persons participating in the visit and their role in the encounter:        -Yevette Edwards, Medical Scribe  Patient's location: Home Provider's location: [place] at [employer] returns today regarding his history of Hodgkin's Lymphoma. The patient's last visit with [place] was on 01/21/2020. The pt reports that he is doing well overall.  The pt reports that he continues experiencing a dry cough and congestion.  Pt endorsed occasional bone pain that he treats with OTC pain medication.  Of note since the patient's last visit, pt has had CT Chest ([id number]) completed on 02/04/2020 with results revealing "3.5 cm partially calcified anterior/superior mediastinal mass, unchanged, likely corresponding to the patient's treated lymphoma. No evidence of acute cardiopulmonary disease."  Of note since the patient's last visit, pt has had CT Soft Tissue Neck ([id number]) completed on 02/04/2020 with results revealing "No evidence of disease in the neck."  Lab results (01/28/20) CMP is as follows: all values are WNL except for Glucose at 101, Calcium at 11.4. 01/28/2020 Ionized Calcium at 5.8 01/28/2020 PTH-related peptide at <2.0 01/28/2020 PTH at 49, Total Calcium at 10.7 01/28/2020 MMP shows all values are WNL  On review of systems, pt reports bone pain, dry cough, congestion  and denies any other symptoms.   MEDICAL HISTORY:  #1 history of Hodgkin's lymphoma diagnosed in April 2014 status post ABVD 6 cycles completed in September 2014. Has remained in complete remission since then. #2 obesity #3 hypertension  SURGICAL HISTORY: Past Surgical History:  Procedure Laterality Date   . ganglion cyst removal      SOCIAL HISTORY: Social History   Socioeconomic History  . Marital status: Married    Spouse name: Not on file  . Number of children: 1  . Years of education: Not on file  . Highest education level: Not on file  Occupational History  . Not on file  Tobacco Use  . Smoking status: Former Smoker    Quit date: 2011    Years since quitting: 10.7  . Smokeless tobacco: Never Used  Vaping Use  . Vaping Use: Never used  Substance and Sexual Activity  . Alcohol use: Yes    Alcohol/week: 14.0 standard drinks    Types: 14 Cans of beer per week  . Drug use: No  . Sexual activity: Not on file  Other Topics Concern  . Not on file  Social History Narrative  . Not on file   Social Determinants of Health   Financial Resource Strain:   . Difficulty of Paying Living Expenses: Not on file  Food Insecurity:   . Worried About Charity fundraiser in the Last Year: Not on file  . Ran Out of Food in the Last Year: Not on file  Transportation Needs:   . Lack of Transportation (Medical): Not on file  . Lack of Transportation (Non-Medical): Not on file  Physical Activity:   . Days of Exercise per Week: Not on file  . Minutes of Exercise per Session: Not on file  Stress:   . Feeling of Stress : Not on file  Social Connections:   . Frequency of Communication with Friends and Family: Not on file  . Frequency of Social Gatherings with Friends and Family: Not on file  . Attends Religious Services: Not on file  . Active Member of Clubs or Organizations: Not on file  . Attends Archivist Meetings: Not on file  . Marital Status: Not on file  Intimate Partner Violence:   . Fear of Current or Ex-Partner: Not on file  . Emotionally Abused: Not on file  . Physically Abused: Not on file  . 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 Remote history of smoking quit in 2011. Social alcohol use Denies any other drug use  FAMILY HISTORY:  Father had nephrectomy for kidney cancer in 47 Mother had non-Hodgkin's lymphoma followed by Hodgkin's lymphoma and eventually died from adenocarcinoma of unknown primary.   ALLERGIES:  has No Known Allergies.  MEDICATIONS:  No current outpatient medications on file.   No current facility-administered medications for this visit.    REVIEW OF SYSTEMS:   A 10+ POINT REVIEW OF SYSTEMS WAS OBTAINED including neurology, dermatology, psychiatry, cardiac, respiratory, lymph, extremities, GI, GU, Musculoskeletal, constitutional, breasts, reproductive, HEENT.  All pertinent positives are noted in the HPI.  All others are negative.   PHYSICAL EXAMINATION: ECOG PERFORMANCE STATUS: 0 - Asymptomatic  . There were no vitals filed for this visit. There were no vitals filed for this visit. .There is no height or weight on file to calculate BMI.   Telehealth visit  LABORATORY DATA:  I have reviewed the data as listed  . CBC Latest Ref Rng & Units 01/21/2020 01/21/2019 01/22/2018  WBC 4.0 - 10.5 K/uL 3.4(L) 3.7(L) 2.9(L)  Hemoglobin 13.0 - 17.0 g/dL 15.0 15.1 14.9  Hematocrit 39 - 52 % 43.2 44.5 43.2  Platelets 150 - 400 K/uL 195 209 169   . CBC    Component Value Date/Time   WBC 3.4 (L) 01/21/2020 0833   RBC 4.94 01/21/2020 0833   HGB 15.0 01/21/2020 0833   HGB 14.9 01/22/2018 0744   HCT 43.2 01/21/2020 0833   PLT 195 01/21/2020 0833   PLT 169 01/22/2018 0744   MCV 87.4 01/21/2020 0833   MCH 30.4 01/21/2020 0833   MCHC 34.7 01/21/2020 0833   RDW 12.3 01/21/2020 0833   LYMPHSABS 1.0 01/21/2020 0833   MONOABS 0.4 01/21/2020 0833   EOSABS 0.2 01/21/2020 0833   BASOSABS 0.0 01/21/2020 0833   . CMP Latest Ref Rng & Units 01/28/2020 01/28/2020 01/21/2020  Glucose 70 - 99 mg/dL 101(H) - 95  BUN 6 - 20 mg/dL 14 - 15  Creatinine 0.61 - 1.24 mg/dL 0.98 - 1.09  Sodium 135 -  145 mmol/L 137 - 140  Potassium 3.5 - 5.1 mmol/L 4.3 - 4.4  Chloride 98 - 111 mmol/L 105 - 106  CO2 22 - 32 mmol/L 23 - 26  Calcium 8.7 - 10.2 mg/dL 11.4(H) 10.7(H) 11.2(H)  Total Protein 6.5 - 8.1 g/dL 7.0 - 7.0  Total Bilirubin 0.3 - 1.2 mg/dL 0.6 - 0.6  Alkaline Phos 38 - 126 U/L 68 - 71  AST 15 - 41 U/L 27 - 27  ALT 0 - 44 U/L 34 - 42   Sed rate 0   RADIOGRAPHIC STUDIES: I have personally reviewed the radiological images as listed and agreed with the findings in the report. CT Soft Tissue Neck W Contrast  Result Date: 02/05/2020 CLINICAL DATA:  Lymphoma follow-up EXAM: CT NECK WITH CONTRAST TECHNIQUE: Multidetector CT imaging of the neck was performed using the standard protocol following the bolus administration of intravenous contrast. CONTRAST:  46mL OMNIPAQUE IOHEXOL 300 MG/ML  SOLN COMPARISON:  None. FINDINGS: Pharynx and larynx: No thickening of Waldeyer's ring. No evidence of mass or inflammation. Salivary glands: No inflammation, mass, or stone. Thyroid: Normal. Lymph nodes: None enlarged or abnormal density. Vascular: Negative. Limited intracranial: Negative. Visualized orbits: Negative. Mastoids and visualized paranasal sinuses: Clear. Skeleton: No acute or aggressive process. Upper chest: Reported separately IMPRESSION: No evidence of disease in the neck.  Chest CT reported separately. Electronically Signed   By: Monte Fantasia M.D.   On: 02/05/2020 04:12   CT Chest W Contrast  Result Date: 02/04/2020 CLINICAL DATA:  Hodgkin's lymphoma EXAM: CT CHEST WITH CONTRAST TECHNIQUE: Multidetector CT imaging of the chest was performed during intravenous contrast administration. CONTRAST:  27mL OMNIPAQUE IOHEXOL 300 MG/ML  SOLN COMPARISON:  Partial comparison to cardiac CT dated 08/03/2017. FINDINGS: Cardiovascular: Heart is normal in size.  No pericardial effusion. No evidence of thoracic aortic aneurysm. Mild atherosclerotic calcifications of the aortic arch. Mediastinum/Nodes: Partially  calcified 2.7 x 3.5 cm anterior/superior mediastinal mass (series 3/image 43), previously 2.6 x 3.6 cm in 2019, unchanged. This likely corresponds to the patient's treated lymphoma. No suspicious mediastinal, hilar, or axillary lymphadenopathy. Visualized thyroid is unremarkable. Lungs/Pleura: Lungs are clear. No suspicious pulmonary nodules. No focal consolidation. No pleural effusion or pneumothorax. Upper Abdomen: Visualized upper abdomen is grossly unremarkable. Spleen is normal in size. Musculoskeletal: Mild degenerative changes  of the visualized thoracolumbar spine. IMPRESSION: 3.5 cm partially calcified anterior/superior mediastinal mass, unchanged, likely corresponding to the patient's treated lymphoma. No evidence of acute cardiopulmonary disease. Aortic Atherosclerosis (ICD10-I70.0). Electronically Signed   By: Julian Hy M.D.   On: 02/04/2020 16:49    ASSESSMENT & PLAN:   49 y.o. male with   1) h/o Classical Hodgkins Lymphoma -Nodular sclerosis type Stage IIB  Diagnosed in January 2014 Treated with ABVD 6 cycles completed in September 2014. Patient has remained in complete remission since then.  PLAN: -Discussed pt labwork today, 02/09/20; all values are WNL except for Glucose at 101, Calcium at 11.4. -Discussed 01/28/2020 Ionized Calcium at 5.8, PTH-related peptide at <2.0, PTH at 49, Total Calcium at 10.7. MMP shows no M Spike.  -Discussed 02/04/2020 CT Chest ([id number]) which revealed "3.5 cm partially calcified anterior/superior mediastinal mass, unchanged, likely corresponding to the patient's treated lymphoma. No evidence of acute cardiopulmonary disease." -Discussed 02/04/2020 CT Soft Tissue Neck ([id number]) which revealed "No evidence of disease in the neck." -No lab, clinical, or radiographic evidence of Hodgkin's progression at this time.  -Pt is seven years out from treatment. Will continue watchful observation. -Hypercalcemia is most likely being caused by  dehydration.  -Recommend pt drink 48-64 oz of water daily and avoid alcohol.  -Recommend pt f/u with PCP in 1 month for labwork -Will see back in 12 months with labs   2) HTN - on losartan  3) Obesity - counseling and active lifestyle and appropriate diet.  4) Hypercalcemia - w/u ordered  FOLLOW UP: F/u with PCP in 1 months for rpt labs RTC with Dr Irene Limbo with labs in 12 months   The total time spent in the appt was 20 minutes and more than 50% was on counseling and direct patient cares.  All of the patient's questions were answered with apparent satisfaction. The patient knows to call the clinic with any problems, questions or concerns.   Vinal Lone MD [place] AAHIVMS [facility] Hematology/Oncology Physician [facility]  (Office):       [phone] (Work cell):  [phone] (Fax):           [phone]  02/09/2020 4:13 PM  I, Yevette Edwards, am acting as a scribe for Dr. Vosler Lone.   .I have reviewed the above documentation for accuracy and completeness, and I agree with the above. Brunetta Genera MD

## 2020-03-11 DIAGNOSIS — F4320 Adjustment disorder, unspecified: Secondary | ICD-10-CM | POA: Diagnosis not present

## 2020-04-02 ENCOUNTER — Ambulatory Visit: Payer: BC Managed Care – PPO | Admitting: Internal Medicine

## 2020-04-02 ENCOUNTER — Ambulatory Visit (INDEPENDENT_AMBULATORY_CARE_PROVIDER_SITE_OTHER)
Admission: RE | Admit: 2020-04-02 | Discharge: 2020-04-02 | Disposition: A | Discharge status: Home / Self Care | Payer: BC Managed Care – PPO | Source: Ambulatory Visit | Attending: Internal Medicine | Admitting: Internal Medicine

## 2020-04-02 ENCOUNTER — Encounter: Payer: Self-pay | Admitting: Internal Medicine

## 2020-04-02 VITALS — BP 140/80 | HR 80 | Ht 69.5 in | Wt 258.5 lb

## 2020-04-02 DIAGNOSIS — R059 Cough, unspecified: Secondary | ICD-10-CM

## 2020-04-02 DIAGNOSIS — Z1211 Encounter for screening for malignant neoplasm of colon: Secondary | ICD-10-CM

## 2020-04-02 DIAGNOSIS — R0789 Other chest pain: Secondary | ICD-10-CM | POA: Diagnosis not present

## 2020-04-02 DIAGNOSIS — R198 Other specified symptoms and signs involving the digestive system and abdomen: Secondary | ICD-10-CM | POA: Diagnosis not present

## 2020-04-02 DIAGNOSIS — K219 Gastro-esophageal reflux disease without esophagitis: Secondary | ICD-10-CM

## 2020-04-02 DIAGNOSIS — R0989 Other specified symptoms and signs involving the circulatory and respiratory systems: Secondary | ICD-10-CM

## 2020-04-02 MED ORDER — PANTOPRAZOLE SODIUM 40 MG PO TBEC: 40.0000 mg | DELAYED_RELEASE_TABLET | Freq: Every day | ORAL | 1 refills | Status: DC

## 2020-04-02 NOTE — Patient Instructions (Addendum)
We have sent the following medications to your pharmacy for you to pick up at your convenience: Pantoprazole 40 mg once daily x 2 months.  Please call our office in 1 month to let us know how the pantoprazole is working for you.  We are requesting your previous colonoscopy records from [facility] [place] in [place] (phone [phone], fax [phone]).  Your provider has requested that you have a chest x ray before leaving today. Please go to the basement floor to our Radiology department for the test.  If you are age 49 or younger, your body mass index should be between 19-25. Your Body mass index is 37.63 kg/m. If this is out of the aformentioned range listed, please consider follow up with your Primary Care Provider.   Due to recent changes in healthcare laws, you may see the results of your imaging and laboratory studies on MyChart before your provider has had a chance to review them.  We understand that in some cases there may be results that are confusing or concerning to you. Not all laboratory results come back in the same time frame and the provider may be waiting for multiple results in order to interpret others.  Please give [place] 48 hours in order for your provider to thoroughly review all the results before contacting the office for clarification of your results.

## 2020-04-02 NOTE — Progress Notes (Addendum)
Patient ID: Marcus [name], male   DOB: 03-Jun-1970, 49 y.o.   MRN: [id number] HPI: Marcus [name] is a 49 year old male with a past medical history of Hodgkin's lymphoma treated with chemotherapy in longstanding remission since 2014, hypertension who is seen in consultation at the request of Dr. Marin Comment to evaluate GERD symptoms, cough, throat clearing and globus sensation.  He is here alone today.  He reports that over the last several years but more noticeable in the last year he has had persistent issues with indigestion and acid reflux symptoms.  He also has intermittent coughing, frequent throat clearing symptom as well as globus sensation.  His coughing is not always directly associated with indigestion.  His cough is also nonproductive.  Cough seems to be worse in the early hours of the day.  He will try to drink water and use cough drops on occasion.  He is not having true dysphagia.  There is no abdominal pain.  Some bloating symptom.  No nausea or vomiting.  Stools are regular without blood or melena.  He has not tried any therapy though he did take a single dose of pantoprazole from a family member and felt like this was somewhat helpful for the short.  He tried it.  He had a colonoscopy in 2012 which he recalls being normal.  This was performed by Dr. Emogene Morgan in [facility].  He reports this was done early because he was being proactive.  He has had mild elevation in calcium levels which is being watched.  There has been no specific diagnosis regarding his elevated calcium.   Past Medical History:  Diagnosis Date  . Hodgkin's disease ([place])    In remission.  . Hodgkin's lymphoma ([place]) 2014   treated with chemotherapy  . Hypertension   . Palpitations     Past Surgical History:  Procedure Laterality Date  . ganglion cyst removal    . LYMPH NODE BIOPSY     neck    Outpatient Medications Prior to Visit  Medication Sig Dispense Refill  . Cholecalciferol (VITAMIN D3 PO) Take 1  tablet by mouth daily.     No facility-administered medications prior to visit.    No Known Allergies  Family History  Problem Relation Age of Onset  . Hodgkin's lymphoma Mother   . Colon cancer Mother   . Non-Hodgkin's lymphoma Mother   . Other Mother        adenenocarcinoma  . Kidney cancer Father   . Heart Problems Father        pacemaker  . Skin cancer Sister   . Diabetes Maternal Grandmother   . Hodgkin's lymphoma Maternal Grandfather     Social History   Tobacco Use  . Smoking status: Former Smoker    Types: Cigarettes    Quit date: 2010    Years since quitting: 11.8  . Smokeless tobacco: Never Used  Vaping Use  . Vaping Use: Never used  Substance Use Topics  . Alcohol use: Yes    Alcohol/week: 14.0 standard drinks    Types: 14 Cans of beer per week    Comment: 6 per week  . Drug use: No    ROS: As per history of present illness, otherwise negative  BP 140/80 (BP Location: Left Arm, Patient Position: Sitting, Cuff Size: Large)   Pulse 80   Ht 5' 9.5" (1.765 m) Comment: height measured without shoes  Wt 258 lb 8 oz (117.3 kg)   BMI 37.63 kg/m  Constitutional: Well-developed  and well-nourished. No distress. HEENT: Normocephalic and atraumatic. Oropharynx is clear and moist. Conjunctivae are normal.  No scleral icterus. Neck: Neck supple. Trachea midline. Cardiovascular: Normal rate, regular rhythm and intact distal pulses. No M/R/G Pulmonary/chest: Effort normal and breath sounds normal. No wheezing, rales or rhonchi. Abdominal: Soft, nontender, nondistended. Bowel sounds active throughout. There are no masses palpable. No hepatosplenomegaly. Extremities: no clubbing, cyanosis, or edema Neurological: Alert and oriented to person place and time. Skin: Skin is warm and dry.  Psychiatric: Normal mood and affect. Behavior is normal.  RELEVANT LABS AND IMAGING: CBC    Component Value Date/Time   WBC 3.4 (L) 01/21/2020 0833   RBC 4.94 01/21/2020 0833    HGB 15.0 01/21/2020 0833   HGB 14.9 01/22/2018 0744   HCT 43.2 01/21/2020 0833   PLT 195 01/21/2020 0833   PLT 169 01/22/2018 0744   MCV 87.4 01/21/2020 0833   MCH 30.4 01/21/2020 0833   MCHC 34.7 01/21/2020 0833   RDW 12.3 01/21/2020 0833   LYMPHSABS 1.0 01/21/2020 0833   MONOABS 0.4 01/21/2020 0833   EOSABS 0.2 01/21/2020 0833   BASOSABS 0.0 01/21/2020 0833    CMP     Component Value Date/Time   NA 137 01/28/2020 1146   K 4.3 01/28/2020 1146   CL 105 01/28/2020 1146   CO2 23 01/28/2020 1146   GLUCOSE 101 (H) 01/28/2020 1146   BUN 14 01/28/2020 1146   CREATININE 0.98 01/28/2020 1146   CALCIUM 11.4 (H) 01/28/2020 1146   CALCIUM 10.7 (H) 01/28/2020 1146   PROT 7.0 01/28/2020 1146   ALBUMIN 4.1 01/28/2020 1146   AST 27 01/28/2020 1146   ALT 34 01/28/2020 1146   ALKPHOS 68 01/28/2020 1146   BILITOT 0.6 01/28/2020 1146   GFRNONAA >60 01/28/2020 1146   GFRAA >60 01/28/2020 1146    ASSESSMENT/PLAN: 49 year old male with a past medical history of Hodgkin's lymphoma treated with chemotherapy in longstanding remission since 2014, hypertension who is seen in consultation at the request of Dr. Marin Comment to evaluate GERD symptoms, cough, throat clearing and globus sensation.    1.  GERD/LPR/globus sensation/cough--his symptoms are very likely related to gastroesophageal reflux disease.  We discussed this today together.  There is no specific alarm symptoms to warrant upper endoscopy immediately and I have recommended that we try medical therapy.  If not responsive or incompletely responsive I will recommend upper endoscopy.  Also GERD diet and lifestyle modification would likely improve symptoms.  He has gained 8 to 10 pounds and so modest weight reduction may also improve reflux symptoms. --Pantoprazole 40 mg once daily, 30 minutes before breakfast x2 months --I asked that he notify me in about 1 month to let me know if symptoms have improved if not upper endoscopy is recommended  2.   Hypercalcemia --mild and not thought to relate to hyperparathyroidism at this time.  This is being monitored by primary care.  We did discuss how hypercalcemia can affect gut motility and also because other symptoms such as nausea and vomiting.  I am not convinced that his reflux symptoms directly relate to his elevated calcium but this should be monitored  3.  CRC screening --he would be due colon cancer screening at age 54 however he had a colonoscopy just less than 10 years ago.  We will attempt to obtain these records and I recommend repeat colonoscopy 10 years from his last exam, assuming this was normal.  Addendum: We attempted to get records from colonoscopy.  We  contacted Dr. Emogene Morgan at [facility].  We received word that no records exist from this patient. With this knowledge, please asked the patient if there is another contact for [place] to obtain colonoscopy and if not would recommend he proceed with screening colonoscopy at this time.     Cc:Le, [address], [address],  [place] [id number]

## 2020-04-08 DIAGNOSIS — F4320 Adjustment disorder, unspecified: Secondary | ICD-10-CM | POA: Diagnosis not present

## 2020-04-26 ENCOUNTER — Other Ambulatory Visit: Payer: Self-pay | Admitting: Internal Medicine

## 2020-05-05 DIAGNOSIS — F4320 Adjustment disorder, unspecified: Secondary | ICD-10-CM | POA: Diagnosis not present

## 2020-05-14 NOTE — Progress Notes (Signed)
I have spoken to patient who indicates that he is unsure regarding who else would have access to his previous colonoscopy report that was completed by Dr Emogene Morgan. He research when he gets home regarding his PCP that was in [place] to see if there is a possibility they would have a copy and will call us back. He has been made aware that if we are unable to obtain a copy of the previous colonoscopy report, we recommend going forward with updated colonoscopy at the present time.  In addition, patient would like Dr Hilarie Fredrickson to know that he feels that the pantoprazole is really helping his reflux symptoms. He has taken 1 full month of the medication and will be starting his second month within the next few days.

## 2020-05-24 ENCOUNTER — Other Ambulatory Visit: Payer: Self-pay | Admitting: Internal Medicine

## 2020-05-26 DIAGNOSIS — Z03818 Encounter for observation for suspected exposure to other biological agents ruled out: Secondary | ICD-10-CM | POA: Diagnosis not present

## 2020-06-09 DIAGNOSIS — Z03818 Encounter for observation for suspected exposure to other biological agents ruled out: Secondary | ICD-10-CM | POA: Diagnosis not present

## 2020-06-19 ENCOUNTER — Other Ambulatory Visit: Payer: Self-pay | Admitting: Internal Medicine

## 2020-08-03 DIAGNOSIS — Z03818 Encounter for observation for suspected exposure to other biological agents ruled out: Secondary | ICD-10-CM | POA: Diagnosis not present

## 2020-12-16 ENCOUNTER — Telehealth: Payer: Self-pay | Admitting: Internal Medicine

## 2020-12-16 NOTE — Telephone Encounter (Signed)
Mr Fiallos sent [place] a request via [place] requesting an appointment for both a Colonoscopy & Endoscopy. His last office visit note from November 2021 says that previous procedure needs to be reviewed before determining the date of his next procedure.  I do not see that we have received those as of yet. Schedulers are unable to see in Care Everywhere, forwarding to [place] to follow up on this request.

## 2020-12-17 NOTE — Telephone Encounter (Signed)
Patient never got back with [place] regarding information on who may have a report of colonoscopy as the physician that he originally thought completed the procedure (Dr Emogene Morgan) does not have any record of procedure on file.  With patients request to go forward with endoscopy/colonoscopy, may we go ahead and schedule without the previous records?   Progress Notes by Larina Bras, CMA at 04/02/2020 9:50 AM  Author: Larina Bras, CMA Author Type: Certified Medical Assistant Filed: 05/14/2020 12:47 PM  Note Status: Signed Cosign: Cosign Not Required Encounter Date: 04/02/2020  Editor: Larina Bras, CMA (Certified Medical Assistant)             I have spoken to patient who indicates that he is unsure regarding who else would have access to his previous colonoscopy report that was completed by Dr Emogene Morgan. He research when he gets home regarding his PCP that was in [place] to see if there is a possibility they would have a copy and will call us back. He has been made aware that if we are unable to obtain a copy of the previous colonoscopy report, we recommend going forward with updated colonoscopy at the present time.

## 2020-12-19 NOTE — Telephone Encounter (Signed)
Yes Ok for egd and colon (the colon for screening) JMP

## 2020-12-20 ENCOUNTER — Encounter: Payer: Self-pay | Admitting: Internal Medicine

## 2020-12-20 NOTE — Telephone Encounter (Signed)
Patient returned call. PV scheduled 8/3 Screening 8/29

## 2020-12-20 NOTE — Telephone Encounter (Signed)
Called to schedule, no answer so I left a voicemail to call back.

## 2020-12-27 DIAGNOSIS — K2210 Ulcer of esophagus without bleeding: Secondary | ICD-10-CM

## 2020-12-27 HISTORY — DX: Ulcer of esophagus without bleeding: K22.10

## 2020-12-29 ENCOUNTER — Ambulatory Visit (AMBULATORY_SURGERY_CENTER): Payer: BC Managed Care – PPO | Admitting: *Deleted

## 2020-12-29 ENCOUNTER — Other Ambulatory Visit: Payer: Self-pay

## 2020-12-29 VITALS — Ht 69.5 in | Wt 254.0 lb

## 2020-12-29 DIAGNOSIS — Z800 Family history of malignant neoplasm of digestive organs: Secondary | ICD-10-CM

## 2020-12-29 DIAGNOSIS — K219 Gastro-esophageal reflux disease without esophagitis: Secondary | ICD-10-CM

## 2020-12-29 DIAGNOSIS — Z1211 Encounter for screening for malignant neoplasm of colon: Secondary | ICD-10-CM

## 2020-12-29 MED ORDER — SUTAB 1479-225-188 MG PO TABS: 1.0000 | ORAL_TABLET | ORAL | 0 refills | Status: DC

## 2020-12-29 NOTE — Progress Notes (Signed)
Patient's pre-visit was done today over the phone with the patient due to COVID-19 pandemic. Name,DOB and address verified. Insurance verified. Patient denies any allergies to Eggs and Soy. Patient denies any problems with anesthesia/sedation. Patient denies taking diet pills or blood thinners. No home Oxygen. Packet of Prep instructions mailed to patient including a copy of a consent form & [facility]-pt is aware. Patient understands to call us back with any questions or concerns. Patient is aware of our care-partner policy and 0000000 safety protocol.   EMMI education assigned to the patient for the procedure, sent to [facility].   The patient is COVID-19 vaccinated.

## 2021-01-24 ENCOUNTER — Other Ambulatory Visit: Payer: Self-pay | Admitting: Internal Medicine

## 2021-01-24 ENCOUNTER — Other Ambulatory Visit: Payer: Self-pay

## 2021-01-24 ENCOUNTER — Ambulatory Visit (AMBULATORY_SURGERY_CENTER): Payer: BC Managed Care – PPO | Admitting: Internal Medicine

## 2021-01-24 ENCOUNTER — Encounter: Payer: Self-pay | Admitting: Internal Medicine

## 2021-01-24 VITALS — BP 125/76 | HR 74 | Temp 97.6°F | Resp 18 | Ht 69.0 in | Wt 254.0 lb

## 2021-01-24 DIAGNOSIS — K2980 Duodenitis without bleeding: Secondary | ICD-10-CM | POA: Diagnosis not present

## 2021-01-24 DIAGNOSIS — K319 Disease of stomach and duodenum, unspecified: Secondary | ICD-10-CM | POA: Diagnosis not present

## 2021-01-24 DIAGNOSIS — D123 Benign neoplasm of transverse colon: Secondary | ICD-10-CM

## 2021-01-24 DIAGNOSIS — Z1211 Encounter for screening for malignant neoplasm of colon: Secondary | ICD-10-CM | POA: Diagnosis not present

## 2021-01-24 DIAGNOSIS — K2100 Gastro-esophageal reflux disease with esophagitis, without bleeding: Secondary | ICD-10-CM

## 2021-01-24 DIAGNOSIS — K2970 Gastritis, unspecified, without bleeding: Secondary | ICD-10-CM | POA: Diagnosis not present

## 2021-01-24 DIAGNOSIS — K2210 Ulcer of esophagus without bleeding: Secondary | ICD-10-CM | POA: Diagnosis not present

## 2021-01-24 DIAGNOSIS — K2990 Gastroduodenitis, unspecified, without bleeding: Secondary | ICD-10-CM

## 2021-01-24 DIAGNOSIS — K209 Esophagitis, unspecified without bleeding: Secondary | ICD-10-CM

## 2021-01-24 DIAGNOSIS — K219 Gastro-esophageal reflux disease without esophagitis: Secondary | ICD-10-CM

## 2021-01-24 MED ORDER — PANTOPRAZOLE SODIUM 40 MG PO TBEC: 40.0000 mg | DELAYED_RELEASE_TABLET | Freq: Two times a day (BID) | ORAL | 3 refills | Status: DC

## 2021-01-24 MED ORDER — SODIUM CHLORIDE 0.9 % IV SOLN: 500.0000 mL | Freq: Once | INTRAVENOUS | Status: DC

## 2021-01-24 NOTE — Progress Notes (Signed)
Called to room to assist during endoscopic procedure.  Patient ID and intended procedure confirmed with present staff. Received instructions for my participation in the procedure from the performing physician.  

## 2021-01-24 NOTE — Op Note (Signed)
[facility] Patient Name: Marcus [name] Procedure Date: 01/24/2021 9:02 AM MRN: [id number] Endoscopist: Jerene Bears , MD Age: 50 Referring MD:  Date of Birth: 1971-03-19 Gender: Male Account #: [id number] Procedure:                Upper GI endoscopy Indications:              Gastro-esophageal reflux disease, globus sensation,                            chronic cough with throat clearing Medicines:                Monitored Anesthesia Care Procedure:                Pre-Anesthesia Assessment:                           - Prior to the procedure, a History and Physical                            was performed, and patient medications and                            allergies were reviewed. The patient's tolerance of                            previous anesthesia was also reviewed. The risks                            and benefits of the procedure and the sedation                            options and risks were discussed with the patient.                            All questions were answered, and informed consent                            was obtained. Prior Anticoagulants: The patient has                            taken no previous anticoagulant or antiplatelet                            agents. ASA Grade Assessment: III - A patient with                            severe systemic disease. After reviewing the risks                            and benefits, the patient was deemed in                            satisfactory condition to undergo the procedure.  After obtaining informed consent, the endoscope was                            passed under direct vision. Throughout the                            procedure, the patient's blood pressure, pulse, and                            oxygen saturations were monitored continuously. The                            Endoscope was introduced through the mouth, and                            advanced to the  second part of duodenum. The upper                            GI endoscopy was accomplished without difficulty.                            The patient tolerated the procedure well. Scope In: Scope Out: Findings:                 LA Grade C (one or more mucosal breaks continuous                            between tops of 2 or more mucosal folds, less than                            75% circumference) esophagitis with no bleeding was                            found in the lower third of the esophagus. Biopsies                            were taken with a cold forceps for histology.                           Severe inflammation characterized by adherent                            blood, congestion (edema), erythema, friability and                            granularity was found in the gastric body and in                            the gastric antrum. Biopsies were taken with a cold                            forceps for histology and Helicobacter pylori  testing.                           Moderate inflammation characterized by erythema was                            found in the duodenal bulb.                           The second portion of the duodenum was normal. Complications:            No immediate complications. Estimated Blood Loss:     Estimated blood loss was minimal. Impression:               - LA Grade C reflux esophagitis with no bleeding.                            Biopsied.                           - Gastritis. Biopsied.                           - Duodenitis.                           - Normal second portion of the duodenum. Recommendation:           - Patient has a contact number available for                            emergencies. The signs and symptoms of potential                            delayed complications were discussed with the                            patient. Return to normal activities tomorrow.                            Written  discharge instructions were provided to the                            patient.                           - Resume previous diet.                           - Continue present medications.                           - Restart pantoprazole at 40 mg twice daily (at                            least 30 min before 1st and last meal of the day).                           -  Await pathology results.                           - Office follow-up in Nov or Dec 2022. Jerene Bears, MD 01/24/2021 9:42:31 AM This report has been signed electronically.

## 2021-01-24 NOTE — Progress Notes (Signed)
Pt's states no medical or surgical changes since previsit or office visit. 

## 2021-01-24 NOTE — Op Note (Signed)
[facility] Patient Name: Marcus [name] Procedure Date: 01/24/2021 9:02 AM MRN: [id number] Endoscopist: Jerene Bears , MD Age: 50 Referring MD:  Date of Birth: Dec 20, 1970 Gender: Male Account #: [id number] Procedure:                Colonoscopy Indications:              Screening for colorectal malignant neoplasm Medicines:                Monitored Anesthesia Care Procedure:                Pre-Anesthesia Assessment:                           - Prior to the procedure, a History and Physical                            was performed, and patient medications and                            allergies were reviewed. The patient's tolerance of                            previous anesthesia was also reviewed. The risks                            and benefits of the procedure and the sedation                            options and risks were discussed with the patient.                            All questions were answered, and informed consent                            was obtained. Prior Anticoagulants: The patient has                            taken no previous anticoagulant or antiplatelet                            agents. ASA Grade Assessment: III - A patient with                            severe systemic disease. After reviewing the risks                            and benefits, the patient was deemed in                            satisfactory condition to undergo the procedure.                           After obtaining informed consent, the colonoscope  was passed under direct vision. Throughout the                            procedure, the patient's blood pressure, pulse, and                            oxygen saturations were monitored continuously. The                            CF [id number] was introduced through the anus                            and advanced to the cecum, identified by                            appendiceal orifice and  ileocecal valve. The                            colonoscopy was performed without difficulty. The                            patient tolerated the procedure well. The quality                            of the bowel preparation was good. The ileocecal                            valve, appendiceal orifice, and rectum were                            photographed. Scope In: 9:17:19 AM Scope Out: 9:37:24 AM Scope Withdrawal Time: 0 hours 14 minutes 54 seconds  Total Procedure Duration: 0 hours 20 minutes 5 seconds  Findings:                 The digital rectal exam was normal.                           A 5 mm polyp was found in the transverse colon. The                            polyp was sessile. The polyp was removed with a                            cold snare. Resection and retrieval were complete.                           Multiple small and large-mouthed diverticula were                            found in the sigmoid colon and descending colon.                            Mild, scattered, erythema and thickened sigmoid  folds was seen in association with the diverticular                            opening.                           Internal hemorrhoids were found during retroflexion. Complications:            No immediate complications. Estimated Blood Loss:     Estimated blood loss was minimal. Impression:               - One 5 mm polyp in the transverse colon, removed                            with a cold snare. Resected and retrieved.                           - Severe diverticulosis in the sigmoid colon and in                            the descending colon.                           - Internal hemorrhoids. Recommendation:           - Patient has a contact number available for                            emergencies. The signs and symptoms of potential                            delayed complications were discussed with the                             patient. Return to normal activities tomorrow.                            Written discharge instructions were provided to the                            patient.                           - Resume previous diet.                           - Continue present medications.                           - Await pathology results.                           - Repeat colonoscopy is recommended. The                            colonoscopy date will be determined after pathology  results from today's exam become available for                            review. Jerene Bears, MD 01/24/2021 9:44:53 AM This report has been signed electronically.

## 2021-01-24 NOTE — Patient Instructions (Addendum)
Please read handouts provided. Continue present medications. Await pathology results. Restart pantoprazole 40 mg twice daily ( at least 30 minutes before first and last meal of the day ). Office follow-up in Nov. Or Dec. 2022.   YOU HAD AN ENDOSCOPIC PROCEDURE TODAY AT [facility]:   Refer to the procedure report that was given to you for any specific questions about what was found during the examination.  If the procedure report does not answer your questions, please call your gastroenterologist to clarify.  If you requested that your care partner not be given the details of your procedure findings, then the procedure report has been included in a sealed envelope for you to review at your convenience later.  YOU SHOULD EXPECT: Some feelings of bloating in the abdomen. Passage of more gas than usual.  Walking can help get rid of the air that was put into your GI tract during the procedure and reduce the bloating. If you had a lower endoscopy (such as a colonoscopy or flexible sigmoidoscopy) you may notice spotting of blood in your stool or on the toilet paper. If you underwent a bowel prep for your procedure, you may not have a normal bowel movement for a few days.  Please Note:  You might notice some irritation and congestion in your nose or some drainage.  This is from the oxygen used during your procedure.  There is no need for concern and it should clear up in a day or so.  SYMPTOMS TO REPORT IMMEDIATELY:  Following lower endoscopy (colonoscopy or flexible sigmoidoscopy):  Excessive amounts of blood in the stool  Significant tenderness or worsening of abdominal pains  Swelling of the abdomen that is new, acute  Fever of 100F or higher  Following upper endoscopy (EGD)  Vomiting of blood or coffee ground material  New chest pain or pain under the shoulder blades  Painful or persistently difficult swallowing  New shortness of breath  Fever of 100F or higher  Black,  tarry-looking stools  For urgent or emergent issues, a gastroenterologist can be reached at any hour by calling [phone]. Do not use MyChart messaging for urgent concerns.    DIET:  We do recommend a small meal at first, but then you may proceed to your regular diet.  Drink plenty of fluids but you should avoid alcoholic beverages for 24 hours.  ACTIVITY:  You should plan to take it easy for the rest of today and you should NOT DRIVE or use heavy machinery until tomorrow (because of the sedation medicines used during the test).    FOLLOW UP: Our staff will call the number listed on your records 48-72 hours following your procedure to check on you and address any questions or concerns that you may have regarding the information given to you following your procedure. If we do not reach you, we will leave a message.  We will attempt to reach you two times.  During this call, we will ask if you have developed any symptoms of COVID 19. If you develop any symptoms (ie: fever, flu-like symptoms, shortness of breath, cough etc.) before then, please call [phone].  If you test positive for Covid 19 in the 2 weeks post procedure, please call and report this information to [place].    If any biopsies were taken you will be contacted by phone or by letter within the next 1-3 weeks.  Please call us at [phone] if you have not heard about the biopsies  in 3 weeks.    SIGNATURES/CONFIDENTIALITY: You and/or your care partner have signed paperwork which will be entered into your electronic medical record.  These signatures attest to the fact that that the information above on your After Visit Summary has been reviewed and is understood.  Full responsibility of the confidentiality of this discharge information lies with you and/or your care-partner.

## 2021-01-24 NOTE — Progress Notes (Signed)
To PACU, VSS. Report to [email or web address] 

## 2021-01-24 NOTE — Progress Notes (Signed)
GASTROENTEROLOGY PROCEDURE H&P NOTE   Primary Care Physician: Pcp, No    Reason for Procedure:  Throat clearing, cough, probable GERD and screening colonoscopy  Plan:    EGD and colonoscopy  Patient is appropriate for endoscopic procedure(s) in the ambulatory ([place]) setting.  The nature of the procedure, as well as the risks, benefits, and alternatives were carefully and thoroughly reviewed with the patient. Ample time for discussion and questions allowed. The patient understood, was satisfied, and agreed to proceed.     HPI: Marcus [name] is a 50 y.o. male who presents for EGD and colonoscopy to evaluate globus sensation, throat clearing and chronic cough along with colorectal cancer screening.  He was seen in the office in November 2021.  No significant changes to medical history since this time.  No recent chest pain or dyspnea.  Pantoprazole did help for the few months that he used it and symptoms while still present are milder than they were at the time of his last office visit  Past Medical History:  Diagnosis Date   GERD (gastroesophageal reflux disease)    Heart murmur    Hodgkin's disease ([place])    In remission.   Hodgkin's lymphoma ([place]) 2014   treated with chemotherapy   Hypertension    Palpitations     Past Surgical History:  Procedure Laterality Date   COLONOSCOPY  2012   in [place]-normal exam per pt   ganglion cyst removal     LYMPH NODE BIOPSY     neck    Prior to Admission medications   Medication Sig Start Date End Date Taking? Authorizing Provider  ibuprofen (ADVIL) 200 MG tablet Take 200 mg by mouth every 6 (six) hours as needed.   Yes [provider]    Current Outpatient Medications  Medication Sig Dispense Refill   ibuprofen (ADVIL) 200 MG tablet Take 200 mg by mouth every 6 (six) hours as needed.     Current Facility-Administered Medications  Medication Dose Route Frequency Provider Last Rate Last Admin   0.9 %  sodium chloride  infusion  500 mL Intravenous Once Emoni Whitworth, Lajuan Lines, MD        Allergies as of 01/24/2021   (No Known Allergies)    Family History  Problem Relation Age of Onset   Hodgkin's lymphoma Mother    Colon cancer Mother 14   Non-Hodgkin's lymphoma Mother    Other Mother        adenenocarcinoma   Colon polyps Father    Kidney cancer Father    Heart Problems Father        pacemaker   Skin cancer Sister    Diabetes Maternal Grandmother    Hodgkin's lymphoma Maternal Grandfather    Esophageal cancer Neg Hx    Stomach cancer Neg Hx    Rectal cancer Neg Hx     Social History   Socioeconomic History   Marital status: Married    Spouse name: Not on file   Number of children: 1   Years of education: Not on file   Highest education level: Not on file  Occupational History   Occupation: Medical Tech  Tobacco Use   Smoking status: Former    Types: Cigarettes    Quit date: 2010    Years since quitting: 12.6   Smokeless tobacco: Never  Vaping Use   Vaping Use: Never used  Substance and Sexual Activity   Alcohol use: Not Currently    Alcohol/week: 10.0 standard drinks  Types: 10 Standard drinks or equivalent per week    Comment: 8-10 drinks per week per pt   Drug use: No   Sexual activity: Not on file  Other Topics Concern   Not on file  Social History Narrative   Not on file   Social Determinants of Health   Financial Resource Strain: Not on file  Food Insecurity: Not on file  Transportation Needs: Not on file  Physical Activity: Not on file  Stress: Not on file  Social Connections: Not on file  Intimate Partner Violence: Not on file    Physical Exam: Vital signs in last 24 hours: '@BP'$  134/80   Pulse 73   Temp 97.6 F (36.4 C)   Ht '5\' 9"'$  (1.753 m)   Wt 254 lb (115.2 kg)   SpO2 97%   BMI 37.51 kg/m  GEN: NAD EYE: Sclerae anicteric ENT: MMM CV: Non-tachycardic Pulm: CTA b/l GI: Soft, NT/ND NEURO:  Alert & Oriented x 3   Zenovia Jarred, MD [facility]  01/24/2021 9:00 AM

## 2021-01-24 NOTE — Progress Notes (Signed)
C.w. VITAL SIGNS. 

## 2021-01-26 ENCOUNTER — Telehealth: Payer: Self-pay

## 2021-01-26 NOTE — Telephone Encounter (Signed)
First post procedure follow up call, no answer 

## 2021-01-26 NOTE — Telephone Encounter (Signed)
  Follow up Call-  Call back number 01/24/2021  Post procedure Call Back phone  # [phone]  Permission to leave phone message Yes  Some recent data might be hidden     Patient questions:  Do you have a fever, pain , or abdominal swelling? No. Pain Score  0 *  Have you tolerated food without any problems? Yes.    Have you been able to return to your normal activities? Yes.    Do you have any questions about your discharge instructions: Diet   No. Medications  No. Follow up visit  No.  Do you have questions or concerns about your Care? No.  Actions: * If pain score is 4 or above: No action needed, pain <4. Have you developed a fever since your procedure? no  2.   Have you had an respiratory symptoms (SOB or cough) since your procedure? no  3.   Have you tested positive for COVID 19 since your procedure no  4.   Have you had any family members/close contacts diagnosed with the COVID 19 since your procedure?  no   If yes to any of these questions please route to Joylene John, RN and Joella Prince, RN

## 2021-01-31 ENCOUNTER — Encounter: Payer: Self-pay | Admitting: *Deleted

## 2021-02-01 ENCOUNTER — Encounter: Payer: Self-pay | Admitting: Internal Medicine

## 2021-02-08 ENCOUNTER — Other Ambulatory Visit: Payer: Self-pay

## 2021-02-08 ENCOUNTER — Ambulatory Visit: Payer: BC Managed Care – PPO | Admitting: Hematology

## 2021-02-08 ENCOUNTER — Inpatient Hospital Stay: Payer: BC Managed Care – PPO | Admitting: Hematology

## 2021-02-08 ENCOUNTER — Inpatient Hospital Stay: Payer: BC Managed Care – PPO | Attending: Hematology

## 2021-02-08 ENCOUNTER — Other Ambulatory Visit: Payer: BC Managed Care – PPO

## 2021-02-08 VITALS — BP 129/89 | HR 83 | Temp 97.7°F | Resp 18 | Ht 69.0 in | Wt 254.1 lb

## 2021-02-08 DIAGNOSIS — C8118 Nodular sclerosis classical Hodgkin lymphoma, lymph nodes of multiple sites: Secondary | ICD-10-CM

## 2021-02-08 DIAGNOSIS — Z87891 Personal history of nicotine dependence: Secondary | ICD-10-CM | POA: Diagnosis not present

## 2021-02-08 DIAGNOSIS — I10 Essential (primary) hypertension: Secondary | ICD-10-CM | POA: Insufficient documentation

## 2021-02-08 LAB — CBC WITH DIFFERENTIAL (CANCER CENTER ONLY)
Abs Immature Granulocytes: 0.01 10*3/uL (ref 0.00–0.07)
Basophils Absolute: 0.1 10*3/uL (ref 0.0–0.1)
Basophils Relative: 1 %
Eosinophils Absolute: 0.2 10*3/uL (ref 0.0–0.5)
Eosinophils Relative: 5 %
HCT: 41.6 % (ref 39.0–52.0)
Hemoglobin: 14.5 g/dL (ref 13.0–17.0)
Immature Granulocytes: 0 %
Lymphocytes Relative: 34 %
Lymphs Abs: 1.2 10*3/uL (ref 0.7–4.0)
MCH: 30.7 pg (ref 26.0–34.0)
MCHC: 34.9 g/dL (ref 30.0–36.0)
MCV: 87.9 fL (ref 80.0–100.0)
Monocytes Absolute: 0.4 10*3/uL (ref 0.1–1.0)
Monocytes Relative: 11 %
Neutro Abs: 1.8 10*3/uL (ref 1.7–7.7)
Neutrophils Relative %: 49 %
Platelet Count: 217 10*3/uL (ref 150–400)
RBC: 4.73 MIL/uL (ref 4.22–5.81)
RDW: 12.4 % (ref 11.5–15.5)
WBC Count: 3.6 10*3/uL — ABNORMAL LOW (ref 4.0–10.5)
nRBC: 0 % (ref 0.0–0.2)

## 2021-02-08 LAB — CMP (CANCER CENTER ONLY)
ALT: 36 U/L (ref 0–44)
AST: 25 U/L (ref 15–41)
Albumin: 3.9 g/dL (ref 3.5–5.0)
Alkaline Phosphatase: 63 U/L (ref 38–126)
Anion gap: 8 (ref 5–15)
BUN: 12 mg/dL (ref 6–20)
CO2: 24 mmol/L (ref 22–32)
Calcium: 10.4 mg/dL — ABNORMAL HIGH (ref 8.9–10.3)
Chloride: 107 mmol/L (ref 98–111)
Creatinine: 1.09 mg/dL (ref 0.61–1.24)
GFR, Estimated: >60 mL/min (ref >60)
Glucose, Bld: 114 mg/dL — ABNORMAL HIGH (ref 70–99)
Potassium: 4.4 mmol/L (ref 3.5–5.1)
Sodium: 139 mmol/L (ref 135–145)
Total Bilirubin: 0.5 mg/dL (ref 0.3–1.2)
Total Protein: 6.8 g/dL (ref 6.5–8.1)

## 2021-02-08 LAB — SEDIMENTATION RATE: Sed Rate: 4 mm/hr (ref 0–16)

## 2021-02-09 LAB — CALCIUM, IONIZED: Calcium, Ionized, Serum: 5.8 mg/dL — ABNORMAL HIGH (ref 4.5–5.6)

## 2021-02-14 NOTE — Progress Notes (Signed)
[name]    HEMATOLOGY/ONCOLOGY CL NOTE  Date of Service: .02/08/2021   Patient Care Team: Glenford Bayley, DO as PCP - General (Family Medicine)  CHIEF COMPLAINTS/PURPOSE OF CONSULTATION:   F/u for remote h/o Hodgkins lymphoma  HISTORY OF PRESENTING ILLNESS:   Marcus [name] is a wonderful 50 y.o. male who has been referred to [place] by Dr .Marin Comment, Tilden Fossa, DO for evaluation and continued management of Hodgkin's lymphoma.  Patient has previously been seen by Dr. Nicki Guadalajara MD at [facility] in [facility] for management of his Hodgkin's [place] and recently moved to [facility] in January 2018 and wanted to continue his follow-up for Hodgkin's lymphoma here.  Patient presented with bilateral cervical lymphadenopathy first noted in December 2013 and then evaluated with a CT of the neck in January 2014. He notes that he also had some night sweats and some fatigue along with the lymphadenopathy. Patient had a lymph node biopsy of the neck initially which was nondiagnostic.    patient had a PET CT scan on 07/11/2012 which showed extensive nodal hypermetabolic lesion throughout the outer neck bilaterally and mediastinum with SUV up to 11.6   Patient required 2 biopsies of his mediastinal nodes by interventional radiology to make a diagnosis of Hodgkin's lymphoma nodular sclerosis type in March 2014.   patient was treated with ABVD for 6 cycles February through September 2014  He has been in complete remission since then. His last clinic visit with medical oncology was in August 2017 when he had no clinical or lab findings suggestive of disease recurrence. He missed a follow-up in February 2018 since he was in the process of moving to [place].  Patient notes that he is enjoying his job in [place]. He notes no overt new enlarged lymph nodes. No fevers no chills no night sweats no unexpected weight loss. No chest pain no shortness of breath. No abdominal pain or distention. No new  skin rashes.  Interval History:  Marcus [name] returns today regarding his history of Hodgkin's Lymphoma. The patient's last visit with [place] was on 02/09/2020. The pt reports that he is doing well overall.  Patient notes that he has no acute new symptoms.  No fevers no chills no night sweats no unexpected weight loss.  No new lumps or bumps that are palpable. He notes that he did he did not follow-up with his primary care physician to monitor his hypercalcemia since his last visit.  He has been eating and drinking well and notes that his energy levels have been stable.  Labs done today 02/25/2021 show CBC/differential within normal limits, CMP is unremarkable other than a borderline calcium level of 10.4 (this is down from 11.4 about 1-year ago), Sedimentation rate is within normal limits at 4. Ionized calcium slightly elevated at 5.8.  He had an endoscopy and colonoscopy done on 01/24/2021 which showed distal esophageal reflux with ulceration no metaplasia or dysplasia or malignancy.  Tubular adenoma in the transverse colon.  No high-grade colonic dysplasia or malignancy.  No bone pains.  No other acute focal symptoms.  MEDICAL HISTORY:  #1 history of Hodgkin's lymphoma diagnosed in April 2014 status post ABVD 6 cycles completed in September 2014. Has remained in complete remission since then. #2 obesity #3 hypertension  SURGICAL HISTORY: Past Surgical History:  Procedure Laterality Date   [place]COPY  2012   in [place]-normal exam per pt   ganglion cyst removal     LYMPH NODE BIOPSY  neck    SOCIAL HISTORY: Social History   Socioeconomic History   Marital status: Married    Spouse name: Not on file   Number of children: 1   Years of education: Not on file   Highest education level: Not on file  Occupational History   Occupation: Medical Tech  Tobacco Use   Smoking status: Former    Types: Cigarettes    Quit date: 2010    Years since quitting: 12.7   Smokeless  tobacco: Never  Vaping Use   Vaping Use: Never used  Substance and Sexual Activity   Alcohol use: Not Currently    Alcohol/week: 10.0 standard drinks    Types: 10 Standard drinks or equivalent per week    Comment: 8-10 drinks per week per pt   Drug use: No   Sexual activity: Not on file  Other Topics Concern   Not on file  Social History Narrative   Not on file   Social Determinants of Health   Financial Resource Strain: Not on file  Food Insecurity: Not on file  Transportation Needs: Not on file  Physical Activity: Not on file  Stress: Not on file  Social Connections: Not on file  Intimate Partner Violence: Not on file  Works as a Optician, dispensing. Married. Has one son Recently moved to [facility] from [place] in January 2018. Remote history of smoking quit in 2011. Social alcohol use Denies any other drug use  FAMILY HISTORY:  Father had nephrectomy for kidney cancer in 11 Mother had non-Hodgkin's lymphoma followed by Hodgkin's lymphoma and eventually died from adenocarcinoma of unknown primary.   ALLERGIES:  has No Known Allergies.  MEDICATIONS:  Current Outpatient Medications  Medication Sig Dispense Refill   ibuprofen (ADVIL) 200 MG tablet Take 200 mg by mouth every 6 (six) hours as needed.     pantoprazole (PROTONIX) 40 MG tablet Take 1 tablet (40 mg total) by mouth 2 (two) times daily before a meal. Pantoprazole 40 mg twice daily ( at least 30 minutes before first and last meal of the day ). 90 tablet 3   No current facility-administered medications for this visit.    REVIEW OF SYSTEMS:   .10 Point review of Systems was done is negative except as noted above.   PHYSICAL EXAMINATION: ECOG PERFORMANCE STATUS: 0 - Asymptomatic  . Vitals:   02/08/21 1016  BP: 129/89  Pulse: 83  Resp: 18  Temp: 97.7 F (36.5 C)  SpO2: 100%   Filed Weights   02/08/21 1016  Weight: 254 lb 1.6 oz (115.3 kg)   .Body mass index is  37.52 kg/m.   Telehealth visit  LABORATORY DATA:  I have reviewed the data as listed  . CBC Latest Ref Rng & Units 02/08/2021 01/21/2020 01/21/2019  WBC 4.0 - 10.5 K/uL 3.6(L) 3.4(L) 3.7(L)  Hemoglobin 13.0 - 17.0 g/dL 14.5 15.0 15.1  Hematocrit 39.0 - 52.0 % 41.6 43.2 44.5  Platelets 150 - 400 K/uL 217 195 209   . CBC    Component Value Date/Time   WBC 3.6 (L) 02/08/2021 0939   WBC 3.4 (L) 01/21/2020 0833   RBC 4.73 02/08/2021 0939   HGB 14.5 02/08/2021 0939   HCT 41.6 02/08/2021 0939   PLT 217 02/08/2021 0939   MCV 87.9 02/08/2021 0939   MCH 30.7 02/08/2021 0939   MCHC 34.9 02/08/2021 0939   RDW 12.4 02/08/2021 0939   LYMPHSABS 1.2 02/08/2021 0939   MONOABS 0.4 02/08/2021 2563  EOSABS 0.2 02/08/2021 0939   BASOSABS 0.1 02/08/2021 0939   . CMP Latest Ref Rng & Units 02/08/2021 01/28/2020 01/28/2020  Glucose 70 - 99 mg/dL 114(H) 101(H) -  BUN 6 - 20 mg/dL 12 14 -  Creatinine 0.61 - 1.24 mg/dL 1.09 0.98 -  Sodium 135 - 145 mmol/L 139 137 -  Potassium 3.5 - 5.1 mmol/L 4.4 4.3 -  Chloride 98 - 111 mmol/L 107 105 -  CO2 22 - 32 mmol/L 24 23 -  Calcium 8.9 - 10.3 mg/dL 10.4(H) 11.4(H) 10.7(H)  Total Protein 6.5 - 8.1 g/dL 6.8 7.0 -  Total Bilirubin 0.3 - 1.2 mg/dL 0.5 0.6 -  Alkaline Phos 38 - 126 U/L 63 68 -  AST 15 - 41 U/L 25 27 -  ALT 0 - 44 U/L 36 34 -   Sed rate 0   RADIOGRAPHIC STUDIES: I have personally reviewed the radiological images as listed and agreed with the findings in the report. No results found.   ASSESSMENT & PLAN:   50 y.o. male with   1) h/o Classical Hodgkins Lymphoma -Nodular sclerosis type Stage IIB  Diagnosed in January 2014 Treated with ABVD 6 cycles completed in September 2014. Patient has remained in complete remission since then.  PLAN: -Discussed pt labwork today, 02/08/21; CBC and sedimentation rate within normal limits.  CMP with borderline hypercalcemia improved from last year. -Patient was to follow-up with his primary care  physician but has not done this since his last visit.  He was recommended to follow-up with his primary care doctor for further evaluation and management of his hypercalcemia. -Recommend pt f/u with PCP in 1 month for labwork -Will see back in 12 months with labs  2) HTN - f/u with PCP  3) Obesity - counseling and active lifestyle and appropriate diet.  4) Hypercalcemia -patient's calcium level is still borderline at 10.4 with ionized calcium of 5.8. 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 Plan -Patient recommended to follow-up with primary care physician for further work-up and monitoring.   FOLLOW UP:  RTC with Dr Irene Limbo with labs in 12 months  . The total time spent in the appointment was 20 minutes and more than 50% was on counseling and direct patient cares.   All of the patient's questions were answered with apparent satisfaction. The patient knows to call the clinic with any problems, questions or concerns.   Fury Lone MD [place] AAHIVMS [facility] Hematology/Oncology Physician [facility]

## 2021-02-15 ENCOUNTER — Telehealth: Payer: Self-pay

## 2021-02-15 ENCOUNTER — Encounter: Payer: Self-pay | Admitting: *Deleted

## 2021-02-15 NOTE — Telephone Encounter (Signed)
Call attempt . Unable to leave a voice message .

## 2021-02-17 LAB — PTH-RELATED PEPTIDE: PTH-related peptide: <2 pmol/L

## 2021-02-18 ENCOUNTER — Telehealth: Payer: Self-pay

## 2021-02-18 NOTE — Telephone Encounter (Signed)
Per Dr Irene Limbo: Contacted pt to let patient know his calcium levels are still running borderline high and that he should follow-up with his primary care physician for further evaluation and management.  Sedimentation rate within normal limits.  Reassuring from perspective of Hodgkin's lymphoma  Pt acknowledged and verbalized understanding.

## 2021-03-31 ENCOUNTER — Ambulatory Visit: Payer: BC Managed Care – PPO | Admitting: Internal Medicine

## 2021-05-31 ENCOUNTER — Encounter: Payer: Self-pay | Admitting: Internal Medicine

## 2021-05-31 ENCOUNTER — Ambulatory Visit: Payer: 59 | Admitting: Internal Medicine

## 2021-05-31 VITALS — BP 110/70 | HR 85 | Ht 72.0 in | Wt 256.4 lb

## 2021-05-31 DIAGNOSIS — K2100 Gastro-esophageal reflux disease with esophagitis, without bleeding: Secondary | ICD-10-CM | POA: Diagnosis not present

## 2021-05-31 DIAGNOSIS — Z86010 Personal history of colonic polyps: Secondary | ICD-10-CM

## 2021-05-31 NOTE — Patient Instructions (Signed)
Please continue pantoprazole 40 mg twice daily.  You will be due for a recall endoscopy in 11/28/21. We will send you a reminder in the mail when it gets closer to that time.  If you are age 51 or older, your body mass index should be between 23-30. Your Body mass index is 34.77 kg/m. If this is out of the aforementioned range listed, please consider follow up with your Primary Care Provider.  If you are age 76 or younger, your body mass index should be between 19-25. Your Body mass index is 34.77 kg/m. If this is out of the aformentioned range listed, please consider follow up with your Primary Care Provider.   ________________________________________________________  The [facility] providers would like to encourage you to use [facility] to communicate with providers for non-urgent requests or questions.  Due to long hold times on the telephone, sending your provider a message by [facility] may be a faster and more efficient way to get a response.  Please allow 48 business hours for a response.  Please remember that this is for non-urgent requests.  _______________________________________________________  Due to recent changes in healthcare laws, you may see the results of your imaging and laboratory studies on MyChart before your provider has had a chance to review them.  We understand that in some cases there may be results that are confusing or concerning to you. Not all laboratory results come back in the same time frame and the provider may be waiting for multiple results in order to interpret others.  Please give [place] 48 hours in order for your provider to thoroughly review all the results before contacting the office for clarification of your results.

## 2021-05-31 NOTE — Progress Notes (Signed)
° °  Subjective:    Patient ID: Marcus [name], male    DOB: Oct 16, 1970, 51 y.o.   MRN: [id number]  HPI Marcus [name] 51 year old male with a history of GERD with esophagitis, adenomatous colon polyp, diverticulosis, prior Hodgkin's lymphoma treated with chemotherapy in longstanding remission since 2014 who is here for follow-up.  He was last here for upper endoscopy and colonoscopy on 01/24/2021.  See procedure notes for details but briefly EGD showed LA grade C reflux esophagitis without dysplasia, gastritis was seen but biopsies negative for H. pylori.  No dysplasia or metaplasia. Colonoscopy revealed a subcentimeter adenoma and diverticulosis  He reports that he is doing and feeling better.  His reflux is under control with pantoprazole which he uses 40 mg twice daily.  He will occasionally forget the second dose of pantoprazole and only 1 or 2 times in the last month as he completely forgotten pantoprazole altogether.  He is found that tunafish seems to bother his reflux more than any other food.  He is not having dysphagia or odynophagia.  No abdominal pain.  Regular bowel movements.  His throat clearing, globus sensation and cough are significantly better.   Review of Systems As per HPI, otherwise negative  Current Medications, Allergies, Past Medical History, Past Surgical History, Family History and Social History were reviewed in [employer] record.    Objective:   Physical Exam BP 110/70    Pulse 85    Ht 6' (1.829 m)    Wt 256 lb 6.4 oz (116.3 kg)    BMI 34.77 kg/m  Gen: awake, alert, NAD HEENT: anicteric Neuro: nonfocal     Assessment & Plan:  51 year old male with a history of GERD with esophagitis, adenomatous colon polyp, diverticulosis, prior Hodgkin's lymphoma treated with chemotherapy in longstanding remission since 2014 who is here for follow-up.    GERD with esophagitis/hx of LPR symptoms --LA grade C esophagitis at EGD in August 2021.  Biopsied  negative for dysplasia and metaplasia.  Symptoms have improved on twice daily PPI.  We discussed symptom control, chronic PPI therapy, as well as surveillance upper endoscopy to ensure healing.  He prefers to wait till later in the year for repeat EGD. --For now we will continue pantoprazole 40 mg twice daily AC --Plan to repeat EGD in about 6 months time to ensure healing of esophagitis.  If esophagitis is healed we could consider reducing to once daily PPI --Continue GERD diet and lifestyle precautions  2.  History of adenomatous colon polyp --1 subcentimeter adenoma, recall colonoscopy would be 7 years which for him would be August 2029  He can follow-up in a year after upper endoscopy if done as discussed above.  30 minutes total spent today including patient facing time, coordination of care, reviewing medical history/procedures/pertinent radiology studies, and documentation of the encounter.

## 2021-06-23 ENCOUNTER — Encounter: Payer: Self-pay | Admitting: Cardiovascular Disease

## 2021-06-23 ENCOUNTER — Ambulatory Visit: Payer: 59 | Admitting: Cardiovascular Disease

## 2021-06-23 ENCOUNTER — Other Ambulatory Visit: Payer: Self-pay

## 2021-06-23 VITALS — BP 130/80 | HR 75 | Ht 72.0 in | Wt 257.2 lb

## 2021-06-23 DIAGNOSIS — R0602 Shortness of breath: Secondary | ICD-10-CM

## 2021-06-23 DIAGNOSIS — R079 Chest pain, unspecified: Secondary | ICD-10-CM

## 2021-06-23 DIAGNOSIS — E669 Obesity, unspecified: Secondary | ICD-10-CM

## 2021-06-23 NOTE — Patient Instructions (Signed)
Medication Instructions:  The current medical regimen is effective;  continue present plan and medications.  *If you need a refill on your cardiac medications before your next appointment, please call your pharmacy*   Testing/Procedures: CALCIUM SCORE   Follow-Up: At [facility], you and your health needs are our priority.  As part of our continuing mission to provide you with exceptional heart care, we have created designated Provider Care Teams.  These Care Teams include your primary Cardiologist (physician) and Advanced Practice Providers (APPs -  Physician Assistants and Nurse Practitioners) who all work together to provide you with the care you need, when you need it.  We recommend signing up for the patient portal called "MyChart".  Sign up information is provided on this After Visit Summary.  MyChart is used to connect with patients for Virtual Visits (Telemedicine).  Patients are able to view lab/test results, encounter notes, upcoming appointments, etc.  Non-urgent messages can be sent to your provider as well.   To learn more about what you can do with MyChart, go to [email or web address].    Your next appointment:   As needed  The format for your next appointment:   In Person  Provider:   Kapolei O'Neal, MD     

## 2021-06-23 NOTE — Progress Notes (Signed)
Cardiology Office Note:   Date:  06/23/2021  NAME:  Marcus [name]    MRN: [id number] DOB:  03-Aug-1970   PCP:  Glenford Bayley, DO  Cardiologist:  None  Electrophysiologist:  None   Referring MD: Glenford Bayley, DO   Chief Complaint  Patient presents with   Shortness of Breath    SOME SOB WHILE DOING HOUSEWORK. NO CHEST PAIN, NO CHEST PRESSURE   History of Present Illness:   ISAI [name] is a 51 y.o. male with a hx of HLD, obesity who is being seen today for the evaluation of chest pain/SOB at the request of Le, Thao P, DO.  Who presents for evaluation of chest pain and shortness of breath.  He reports no history of heart disease.  Followed by cardiology in 2019.  Really was quite healthy at the time.  He reports he is turned 70 and wants to know about risk assessment.  He reports he can get short of breath with activity which he attributes to his weight.  BMI 35.  His EKG in office demonstrates normal sinus rhythm.  Cardiovascular examination is normal.  Recent lab work shows a total cholesterol of 218, LDL 129, HDL 73, triglycerides 99.  LP(a) was within limits.  High-sensitivity CRP 0.8 which is normal.  He had normal serum creatinine and normal liver profile.  A1c 5.6.  He is not hypertensive.  On no medications.  He does suffer from acid reflux.  He reports discomfort in his chest that occurs randomly.  Protonix has helped.  His 10-year ASCVD risk or is low at 2.7%.  We discussed overall he is quite healthy and low risk for future heart disease development.  No strong family history of heart disease.  He is a former smoker.  No alcohol or drug use is reported.  He is married with 1 child.  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  Problem List Hodgkin's lymphoma  -remission 2. HTN 3. GERD  Past Medical History: Past Medical History:  Diagnosis Date   Diverticulosis    Esophageal ulceration 12/2020   GERD (gastroesophageal reflux disease)    Heart murmur    Hodgkin's  disease ([place])    In remission.   Hodgkin's lymphoma ([place]) 2014   treated with chemotherapy   Hypertension    Internal hemorrhoids    Palpitations    Tubular adenoma of colon     Past Surgical History: Past Surgical History:  Procedure Laterality Date   COLONOSCOPY  2012   in [place]-normal exam per pt   ganglion cyst removal     LYMPH NODE BIOPSY     neck    Current Medications: Current Meds  Medication Sig   ibuprofen (ADVIL) 200 MG tablet Take 200 mg by mouth every 6 (six) hours as needed.   pantoprazole (PROTONIX) 40 MG tablet Take 1 tablet (40 mg total) by mouth 2 (two) times daily before a meal. Pantoprazole 40 mg twice daily ( at least 30 minutes before first and last meal of the day ).     Allergies:    Patient has no known allergies.   Social History: Social History   Socioeconomic History   Marital status: Married    Spouse name: Not on file   Number of children: 1   Years of education: Not on file   Highest education level: Not on file  Occupational History   Occupation: Medical Tech  Tobacco Use   Smoking  status: Former    Packs/day: 0.50    Years: 12.00    Pack years: 6.00    Types: Cigarettes    Quit date: 2010    Years since quitting: 13.0   Smokeless tobacco: Never  Vaping Use   Vaping Use: Never used  Substance and Sexual Activity   Alcohol use: Not Currently    Alcohol/week: 10.0 standard drinks    Types: 10 Standard drinks or equivalent per week    Comment: 8-10 drinks per week per pt   Drug use: No   Sexual activity: Yes  Other Topics Concern   Not on file  Social History Narrative   ** Merged History Encounter **       Social Determinants of Health   Financial Resource Strain: Not on file  Food Insecurity: Not on file  Transportation Needs: Not on file  Physical Activity: Not on file  Stress: Not on file  Social Connections: Not on file     Family History: The patient's family history includes Colon cancer (age of onset: 87)  in his mother; Colon polyps in his father; Diabetes in his maternal grandmother; Heart Problems in his father; Hodgkin's lymphoma in his maternal grandfather and mother; Kidney cancer in his father; Non-Hodgkin's lymphoma in his mother; Other in his mother; Skin cancer in his sister. There is no history of Esophageal cancer, Stomach cancer, or Rectal cancer.  ROS:   All other ROS reviewed and negative. Pertinent positives noted in the HPI.     EKGs/Labs/Other Studies Reviewed:   The following studies were personally reviewed by me today:  EKG:  EKG is ordered today.  The ekg ordered today demonstrates normal sinus rhythm heart rate 75, no acute ischemic changes or evidence of infarction, and was personally reviewed by me.   Recent Labs: 02/08/2021: ALT 36; BUN 12; Creatinine 1.09; Hemoglobin 14.5; Platelet Count 217; Potassium 4.4; Sodium 139   Recent Lipid Panel No results found for: CHOL, TRIG, HDL, CHOLHDL, VLDL, LDLCALC, LDLDIRECT  Physical Exam:   VS:  BP 130/80    Pulse 75    Ht 6' (1.829 m)    Wt 257 lb 3.2 oz (116.7 kg)    SpO2 97%    BMI 34.88 kg/m    Wt Readings from Last 3 Encounters:  06/23/21 257 lb 3.2 oz (116.7 kg)  05/31/21 256 lb 6.4 oz (116.3 kg)  02/08/21 254 lb 1.6 oz (115.3 kg)    General: Well nourished, well developed, in no acute distress Head: Atraumatic, normal size  Eyes: PEERLA, EOMI  Neck: Supple, no JVD Endocrine: No thryomegaly Cardiac: Normal S1, S2; RRR; no murmurs, rubs, or gallops Lungs: Clear to auscultation bilaterally, no wheezing, rhonchi or rales  Abd: Soft, nontender, no hepatomegaly  Ext: No edema, pulses 2+ Musculoskeletal: No deformities, BUE and BLE strength normal and equal Skin: Warm and dry, no rashes   Neuro: Alert and oriented to person, place, time, and situation, CNII-XII grossly intact, no focal deficits  Psych: Normal mood and affect   ASSESSMENT:   Marcus [name] is a 51 y.o. male who presents for the following: 1. SOB  (shortness of breath)   2. Chest pain of uncertain etiology   3. Obesity (BMI 30-39.9)     PLAN:   1. SOB (shortness of breath) 2. Chest pain of uncertain etiology 3. Obesity (BMI 30-39.9) -He presents with intermittent episodes of chest discomfort.  This is improved by Protonix.  I suspect this is acid reflux  related.  He can get short of breath at times but this occurs likely related to inactivity.  He is also obese.  I have recommended regular exercise.  His EKG demonstrates sinus rhythm with no acute ischemic changes or evidence of infarction.  His cardiovascular examination is normal.  Recent lab work is unremarkable.  His LDL cholesterol is 129.  10-year ASCVD risk score is 2.7% which is low risk.  His only real risk factor for heart disease is former tobacco abuse.  He has quit for over 10 years.  We discussed calcium scoring for further restratification.  If this is negative would recommend he just continue with lifestyle modification.  Nothing is alarming today during my interview or on exam.  We did spend a lot of time on the visit talking about proper diet and exercise.  I have recommended heart healthy diet as well as regular exercise.  Disposition: Return if symptoms worsen or fail to improve.  Medication Adjustments/Labs and Tests Ordered: Current medicines are reviewed at length with the patient today.  Concerns regarding medicines are outlined above.  Orders Placed This Encounter  Procedures   CT CARDIAC SCORING (SELF PAY ONLY)   EKG 12-Lead   No orders of the defined types were placed in this encounter.   Patient Instructions  Medication Instructions:  The current medical regimen is effective;  continue present plan and medications.  *If you need a refill on your cardiac medications before your next appointment, please call your pharmacy*   Testing/Procedures: CALCIUM SCORE   Follow-Up: At [facility], you and your health needs are our priority.  As part of our  continuing mission to provide you with exceptional heart care, we have created designated Provider Care Teams.  These Care Teams include your primary Cardiologist (physician) and Advanced Practice Providers (APPs -  Physician Assistants and Nurse Practitioners) who all work together to provide you with the care you need, when you need it.  We recommend signing up for the patient portal called "MyChart".  Sign up information is provided on this After Visit Summary.  MyChart is used to connect with patients for Virtual Visits (Telemedicine).  Patients are able to view lab/test results, encounter notes, upcoming appointments, etc.  Non-urgent messages can be sent to your provider as well.   To learn more about what you can do with MyChart, go to [email or web address].    Your next appointment:   As needed  The format for your next appointment:   In Person  Provider:   Eleonore Chiquito, MD       Signed, Addison Naegeli. Audie Box, MD, [place]  [address], [place], [place] [address] [phone]  06/23/2021 9:38 AM

## 2021-07-29 ENCOUNTER — Other Ambulatory Visit: Payer: Self-pay | Admitting: Internal Medicine

## 2021-08-09 ENCOUNTER — Ambulatory Visit (INDEPENDENT_AMBULATORY_CARE_PROVIDER_SITE_OTHER)
Admission: RE | Admit: 2021-08-09 | Discharge: 2021-08-09 | Disposition: A | Discharge status: Home / Self Care | Payer: Self-pay | Source: Ambulatory Visit | Attending: Cardiovascular Disease | Admitting: Cardiovascular Disease

## 2021-08-09 ENCOUNTER — Other Ambulatory Visit: Payer: Self-pay

## 2021-08-09 DIAGNOSIS — R079 Chest pain, unspecified: Secondary | ICD-10-CM

## 2021-08-12 ENCOUNTER — Other Ambulatory Visit: Payer: Self-pay

## 2021-08-12 MED ORDER — ATORVASTATIN CALCIUM 20 MG PO TABS: 20.0000 mg | ORAL_TABLET | Freq: Every day | ORAL | 3 refills | Status: AC

## 2022-01-26 ENCOUNTER — Encounter: Payer: Self-pay | Admitting: Internal Medicine

## 2022-02-03 ENCOUNTER — Other Ambulatory Visit: Payer: Self-pay | Admitting: *Deleted

## 2022-02-03 DIAGNOSIS — C8118 Nodular sclerosis classical Hodgkin lymphoma, lymph nodes of multiple sites: Secondary | ICD-10-CM

## 2022-02-06 ENCOUNTER — Other Ambulatory Visit: Payer: Self-pay

## 2022-02-06 ENCOUNTER — Inpatient Hospital Stay (HOSPITAL_BASED_OUTPATIENT_CLINIC_OR_DEPARTMENT_OTHER): Payer: 59 | Admitting: Hematology

## 2022-02-06 ENCOUNTER — Inpatient Hospital Stay: Payer: 59 | Attending: Hematology

## 2022-02-06 VITALS — BP 134/92 | HR 83 | Temp 97.9°F | Resp 18 | Ht 72.0 in | Wt 260.1 lb

## 2022-02-06 DIAGNOSIS — Z87891 Personal history of nicotine dependence: Secondary | ICD-10-CM | POA: Diagnosis not present

## 2022-02-06 DIAGNOSIS — Z8571 Personal history of Hodgkin lymphoma: Secondary | ICD-10-CM | POA: Diagnosis present

## 2022-02-06 DIAGNOSIS — C8118 Nodular sclerosis classical Hodgkin lymphoma, lymph nodes of multiple sites: Secondary | ICD-10-CM

## 2022-02-06 DIAGNOSIS — I10 Essential (primary) hypertension: Secondary | ICD-10-CM | POA: Insufficient documentation

## 2022-02-06 DIAGNOSIS — E669 Obesity, unspecified: Secondary | ICD-10-CM | POA: Diagnosis not present

## 2022-02-06 DIAGNOSIS — Z79899 Other long term (current) drug therapy: Secondary | ICD-10-CM | POA: Diagnosis not present

## 2022-02-06 LAB — CMP (CANCER CENTER ONLY)
ALT: 33 U/L (ref 0–44)
AST: 25 U/L (ref 15–41)
Albumin: 4.4 g/dL (ref 3.5–5.0)
Alkaline Phosphatase: 62 U/L (ref 38–126)
Anion gap: 4 — ABNORMAL LOW (ref 5–15)
BUN: 15 mg/dL (ref 6–20)
CO2: 27 mmol/L (ref 22–32)
Calcium: 10.4 mg/dL — ABNORMAL HIGH (ref 8.9–10.3)
Chloride: 105 mmol/L (ref 98–111)
Creatinine: 1.09 mg/dL (ref 0.61–1.24)
GFR, Estimated: >60 mL/min (ref >60)
Glucose, Bld: 104 mg/dL — ABNORMAL HIGH (ref 70–99)
Potassium: 4.7 mmol/L (ref 3.5–5.1)
Sodium: 136 mmol/L (ref 135–145)
Total Bilirubin: 0.4 mg/dL (ref 0.3–1.2)
Total Protein: 7.1 g/dL (ref 6.5–8.1)

## 2022-02-06 LAB — CBC WITH DIFFERENTIAL (CANCER CENTER ONLY)
Abs Immature Granulocytes: 0.02 10*3/uL (ref 0.00–0.07)
Basophils Absolute: 0.1 10*3/uL (ref 0.0–0.1)
Basophils Relative: 2 %
Eosinophils Absolute: 0.2 10*3/uL (ref 0.0–0.5)
Eosinophils Relative: 6 %
HCT: 43.6 % (ref 39.0–52.0)
Hemoglobin: 15.2 g/dL (ref 13.0–17.0)
Immature Granulocytes: 1 %
Lymphocytes Relative: 34 %
Lymphs Abs: 1.2 10*3/uL (ref 0.7–4.0)
MCH: 30.7 pg (ref 26.0–34.0)
MCHC: 34.9 g/dL (ref 30.0–36.0)
MCV: 88.1 fL (ref 80.0–100.0)
Monocytes Absolute: 0.5 10*3/uL (ref 0.1–1.0)
Monocytes Relative: 15 %
Neutro Abs: 1.5 10*3/uL — ABNORMAL LOW (ref 1.7–7.7)
Neutrophils Relative %: 42 %
Platelet Count: 208 10*3/uL (ref 150–400)
RBC: 4.95 MIL/uL (ref 4.22–5.81)
RDW: 12.6 % (ref 11.5–15.5)
WBC Count: 3.4 10*3/uL — ABNORMAL LOW (ref 4.0–10.5)
nRBC: 0 % (ref 0.0–0.2)

## 2022-02-06 LAB — SEDIMENTATION RATE: Sed Rate: 3 mm/hr (ref 0–16)

## 2022-02-07 LAB — CALCIUM, IONIZED: Calcium, Ionized, Serum: 5.7 mg/dL — ABNORMAL HIGH (ref 4.5–5.6)

## 2022-02-10 ENCOUNTER — Telehealth: Payer: Self-pay | Admitting: Hematology

## 2022-02-10 NOTE — Telephone Encounter (Signed)
Left message with follow-up appointment per 9/11 los.

## 2022-02-11 LAB — PTH-RELATED PEPTIDE: PTH-related peptide: <2 pmol/L

## 2022-02-12 NOTE — Progress Notes (Signed)
[name]    HEMATOLOGY/ONCOLOGY CLINIC NOTE  Date of Service: .02/06/2022   Patient Care Team: Glenford Bayley, DO as PCP - General (Family Medicine)  CHIEF COMPLAINTS/PURPOSE OF CONSULTATION:   Follow-up for continued evaluation and management of remote history of Hodgkin's lymphoma  HISTORY OF PRESENTING ILLNESS:   Marcus [name] is a wonderful 51 y.o. male who has been referred to [place] by Dr .Marin Comment, Tilden Fossa, DO for evaluation and continued management of Hodgkin's lymphoma.  Patient has previously been seen by Dr. Nicki Guadalajara MD at [facility] in [facility] for management of his Hodgkin's [place] and recently moved to [facility] in January 2018 and wanted to continue his follow-up for Hodgkin's lymphoma here.  Patient presented with bilateral cervical lymphadenopathy first noted in December 2013 and then evaluated with a CT of the neck in January 2014. He notes that he also had some night sweats and some fatigue along with the lymphadenopathy. Patient had a lymph node biopsy of the neck initially which was nondiagnostic.    patient had a PET CT scan on 07/11/2012 which showed extensive nodal hypermetabolic lesion throughout the outer neck bilaterally and mediastinum with SUV up to 11.6   Patient required 2 biopsies of his mediastinal nodes by interventional radiology to make a diagnosis of Hodgkin's lymphoma nodular sclerosis type in March 2014.   patient was treated with ABVD for 6 cycles February through September 2014  He has been in complete remission since then. His last clinic visit with medical oncology was in August 2017 when he had no clinical or lab findings suggestive of disease recurrence. He missed a follow-up in February 2018 since he was in the process of moving to [place].  Patient notes that he is enjoying his job in [place]. He notes no overt new enlarged lymph nodes. No fevers no chills no night sweats no unexpected weight loss. No chest pain no  shortness of breath. No abdominal pain or distention. No new skin rashes.  Interval History:  [name] is here for his 1 year follow-up for remote history of Hodgkin's lymphoma.  He is now 9 no years out from his ABVD treatment.. Notes no new lumps or bumps.  No unexplained fevers chills or night sweats.  No new unexpected weight loss. No other acute new focal symptoms. Has had intermittent issues with acid reflux. Labs done today were reviewed in detail with the patient.  MEDICAL HISTORY:  #1 history of Hodgkin's lymphoma diagnosed in April 2014 status post ABVD 6 cycles completed in September 2014. Has remained in complete remission since then. #2 obesity #3 hypertension  SURGICAL HISTORY: Past Surgical History:  Procedure Laterality Date   COLONOSCOPY  2012   in [place]-normal exam per pt   ganglion cyst removal     LYMPH NODE BIOPSY     neck    SOCIAL HISTORY: Social History   Socioeconomic History   Marital status: Married    Spouse name: Not on file   Number of children: 1   Years of education: Not on file   Highest education level: Not on file  Occupational History   Occupation: Medical Tech  Tobacco Use   Smoking status: Former    Packs/day: 0.50    Years: 12.00    Total pack years: 6.00    Types: Cigarettes    Quit date: 2010    Years since quitting: 13.7   Smokeless tobacco: Never  Vaping Use   Vaping Use: Never  used  Substance and Sexual Activity   Alcohol use: Not Currently    Alcohol/week: 10.0 standard drinks of alcohol    Types: 10 Standard drinks or equivalent per week    Comment: 8-10 drinks per week per pt   Drug use: No   Sexual activity: Yes  Other Topics Concern   Not on file  Social History Narrative   ** Merged History Encounter **       Social Determinants of Health   Financial Resource Strain: Not on file  Food Insecurity: Not on file  Transportation Needs: Not on file  Physical Activity: Not on file  Stress: Not on file   Social Connections: Not on file  Intimate Partner Violence: Not on file  Works as a Optician, dispensing. Married. Has one son Recently moved to [facility] from [place] in January 2018. Remote history of smoking quit in 2011. Social alcohol use Denies any other drug use  FAMILY HISTORY:  Father had nephrectomy for kidney cancer in 1 Mother had non-Hodgkin's lymphoma followed by Hodgkin's lymphoma and eventually died from adenocarcinoma of unknown primary.   ALLERGIES:  has No Known Allergies.  MEDICATIONS:  Current Outpatient Medications  Medication Sig Dispense Refill   atorvastatin (LIPITOR) 20 MG tablet Take 1 tablet (20 mg total) by mouth daily. 90 tablet 3   ibuprofen (ADVIL) 200 MG tablet Take 200 mg by mouth every 6 (six) hours as needed.     pantoprazole (PROTONIX) 40 MG tablet Take 1 tablet (40 mg total) by mouth 2 (two) times daily before a meal. 180 tablet 1   No current facility-administered medications for this visit.    REVIEW OF SYSTEMS:   10 Point review of Systems was done is negative except as noted above.  PHYSICAL EXAMINATION: ECOG PERFORMANCE STATUS: 0 - Asymptomatic  . Vitals:   02/06/22 0911  BP: (!) 134/92  Pulse: 83  Resp: 18  Temp: 97.9 F (36.6 C)  SpO2: 98%   Filed Weights   02/06/22 0911  Weight: 260 lb 1.6 oz (118 kg)   .Body mass index is 35.28 kg/m.  [name] GENERAL:alert, in no acute distress and comfortable SKIN: no acute rashes, no significant lesions EYES: conjunctiva are pink and non-injected, sclera anicteric OROPHARYNX: MMM, no exudates, no oropharyngeal erythema or ulceration NECK: supple, no JVD LYMPH:  no palpable lymphadenopathy in the cervical, axillary or inguinal regions LUNGS: clear to auscultation b/l with normal respiratory effort HEART: regular rate & rhythm ABDOMEN:  normoactive bowel sounds , non tender, not distended. Extremity: no pedal edema PSYCH: alert & oriented x 3 with  fluent speech NEURO: no focal motor/sensory deficits   LABORATORY DATA:  I have reviewed the data as listed  .    Latest Ref Rng & Units 02/06/2022    8:53 AM 02/08/2021    9:39 AM 01/21/2020    8:33 AM  CBC  WBC 4.0 - 10.5 K/uL 3.4  3.6  3.4   Hemoglobin 13.0 - 17.0 g/dL 15.2  14.5  15.0   Hematocrit 39.0 - 52.0 % 43.6  41.6  43.2   Platelets 150 - 400 K/uL 208  217  195    . CBC    Component Value Date/Time   WBC 3.4 (L) 02/06/2022 0853   WBC 3.4 (L) 01/21/2020 0833   RBC 4.95 02/06/2022 0853   HGB 15.2 02/06/2022 0853   HCT 43.6 02/06/2022 0853   PLT 208 02/06/2022 0853   MCV 88.1 02/06/2022  0853   MCH 30.7 02/06/2022 0853   MCHC 34.9 02/06/2022 0853   RDW 12.6 02/06/2022 0853   LYMPHSABS 1.2 02/06/2022 0853   MONOABS 0.5 02/06/2022 0853   EOSABS 0.2 02/06/2022 0853   BASOSABS 0.1 02/06/2022 0853   .    Latest Ref Rng & Units 02/06/2022    8:53 AM 02/08/2021    9:39 AM 01/28/2020   11:46 AM  CMP  Glucose 70 - 99 mg/dL 104  114  101   BUN 6 - 20 mg/dL '15  12  14   '$ Creatinine 0.61 - 1.24 mg/dL 1.09  1.09  0.98   Sodium 135 - 145 mmol/L 136  139  137   Potassium 3.5 - 5.1 mmol/L 4.7  4.4  4.3   Chloride 98 - 111 mmol/L 105  107  105   CO2 22 - 32 mmol/L '27  24  23   '$ Calcium 8.9 - 10.3 mg/dL 10.4  10.4  11.4    10.7   Total Protein 6.5 - 8.1 g/dL 7.1  6.8  7.0   Total Bilirubin 0.3 - 1.2 mg/dL 0.4  0.5  0.6   Alkaline Phos 38 - 126 U/L 62  63  68   AST 15 - 41 U/L '25  25  27   '$ ALT 0 - 44 U/L 33  36  34    Sed rate 3 Ionized calcium 5.7 PTHRP <2  RADIOGRAPHIC STUDIES: I have personally reviewed the radiological images as listed and agreed with the findings in the report. No results found.   ASSESSMENT & PLAN:   51 y.o. male with   1) h/o Classical Hodgkins Lymphoma -Nodular sclerosis type Stage IIB  Diagnosed in January 2014 Treated with ABVD 6 cycles completed in September 2014. Patient has remained in complete remission since  then.  PLAN: -.labs done today were discussed in details with labs the patient -cbc stable CMP stable wiith high normal calcium levels of 10.4 Sed rate wnl @ 3 Patient has no lab or clinical symptoms suggestive of Hodgkins lymphoma progression at this time.  2) HTN - f/u with PCP  3) Obesity - counseling and active lifestyle and appropriate diet.  4) Hypercalcemia -patient's calcium level is still borderline at 10.4 with ionized calcium of 5.8. 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 Non progressive. Plan -continue f/u with PCP   FOLLOW UP:  RTC with Dr Irene Limbo with labs in 12 months  .The total time spent in the appointment was 20 minutes* .  All of the patient's questions were answered with apparent satisfaction. The patient knows to call the clinic with any problems, questions or concerns.   Hallmon Lone MD MS AAHIVMS [facility] Hematology/Oncology Physician [facility]  .*Total Encounter Time as defined by the Centers for Medicare and Medicaid Services includes, in addition to the face-to-face time of a patient visit (documented in the note above) non-face-to-face time: obtaining and reviewing outside history, ordering and reviewing medications, tests or procedures, care coordination (communications with other health care professionals or caregivers) and documentation in the medical record.

## 2022-03-13 ENCOUNTER — Other Ambulatory Visit: Payer: Self-pay | Admitting: Internal Medicine

## 2022-04-18 ENCOUNTER — Telehealth: Payer: Self-pay | Admitting: Internal Medicine

## 2022-04-18 MED ORDER — PANTOPRAZOLE SODIUM 40 MG PO TBEC: 40.0000 mg | DELAYED_RELEASE_TABLET | Freq: Two times a day (BID) | ORAL | 1 refills | Status: AC

## 2022-04-18 NOTE — Telephone Encounter (Signed)
Pantoprazole refilled as requested. [place] up to date on visits.

## 2022-04-18 NOTE — Telephone Encounter (Signed)
Inbound call from patient stating he needs a refill for Protonix. Please advise.

## 2022-05-03 ENCOUNTER — Ambulatory Visit: Payer: 59 | Admitting: Internal Medicine

## 2022-07-20 ENCOUNTER — Ambulatory Visit: Payer: 59 | Admitting: Internal Medicine

## 2022-10-13 ENCOUNTER — Ambulatory Visit: Payer: 59 | Admitting: Internal Medicine

## 2023-02-05 ENCOUNTER — Ambulatory Visit: Payer: 59 | Admitting: Internal Medicine

## 2023-02-06 ENCOUNTER — Other Ambulatory Visit: Payer: Self-pay

## 2023-02-06 DIAGNOSIS — C8118 Nodular sclerosis classical Hodgkin lymphoma, lymph nodes of multiple sites: Secondary | ICD-10-CM

## 2023-02-07 ENCOUNTER — Inpatient Hospital Stay: Payer: 59

## 2023-02-07 ENCOUNTER — Inpatient Hospital Stay: Payer: 59 | Attending: Hematology | Admitting: Hematology

## 2023-02-07 VITALS — BP 130/95 | HR 80 | Temp 98.3°F | Resp 18 | Wt 250.6 lb

## 2023-02-07 DIAGNOSIS — E669 Obesity, unspecified: Secondary | ICD-10-CM | POA: Insufficient documentation

## 2023-02-07 DIAGNOSIS — Z79899 Other long term (current) drug therapy: Secondary | ICD-10-CM | POA: Diagnosis not present

## 2023-02-07 DIAGNOSIS — I10 Essential (primary) hypertension: Secondary | ICD-10-CM | POA: Insufficient documentation

## 2023-02-07 DIAGNOSIS — C8118 Nodular sclerosis classical Hodgkin lymphoma, lymph nodes of multiple sites: Secondary | ICD-10-CM | POA: Diagnosis not present

## 2023-02-07 DIAGNOSIS — Z87891 Personal history of nicotine dependence: Secondary | ICD-10-CM | POA: Diagnosis not present

## 2023-02-07 DIAGNOSIS — Z8572 Personal history of non-Hodgkin lymphomas: Secondary | ICD-10-CM | POA: Diagnosis present

## 2023-02-07 LAB — CBC WITH DIFFERENTIAL (CANCER CENTER ONLY)
Abs Immature Granulocytes: 0.02 10*3/uL (ref 0.00–0.07)
Basophils Absolute: 0.1 10*3/uL (ref 0.0–0.1)
Basophils Relative: 1 %
Eosinophils Absolute: 0.1 10*3/uL (ref 0.0–0.5)
Eosinophils Relative: 3 %
HCT: 42.5 % (ref 39.0–52.0)
Hemoglobin: 15 g/dL (ref 13.0–17.0)
Immature Granulocytes: 0 %
Lymphocytes Relative: 12 %
Lymphs Abs: 0.5 10*3/uL — ABNORMAL LOW (ref 0.7–4.0)
MCH: 31 pg (ref 26.0–34.0)
MCHC: 35.3 g/dL (ref 30.0–36.0)
MCV: 87.8 fL (ref 80.0–100.0)
Monocytes Absolute: 0.5 10*3/uL (ref 0.1–1.0)
Monocytes Relative: 12 %
Neutro Abs: 3.2 10*3/uL (ref 1.7–7.7)
Neutrophils Relative %: 72 %
Platelet Count: 168 10*3/uL (ref 150–400)
RBC: 4.84 MIL/uL (ref 4.22–5.81)
RDW: 12.3 % (ref 11.5–15.5)
WBC Count: 4.5 10*3/uL (ref 4.0–10.5)
nRBC: 0 % (ref 0.0–0.2)

## 2023-02-07 LAB — SEDIMENTATION RATE: Sed Rate: 2 mm/h (ref 0–16)

## 2023-02-07 LAB — CMP (CANCER CENTER ONLY)
ALT: 30 U/L (ref 0–44)
AST: 25 U/L (ref 15–41)
Albumin: 4.4 g/dL (ref 3.5–5.0)
Alkaline Phosphatase: 65 U/L (ref 38–126)
Anion gap: 7 (ref 5–15)
BUN: 18 mg/dL (ref 6–20)
CO2: 25 mmol/L (ref 22–32)
Calcium: 11.1 mg/dL — ABNORMAL HIGH (ref 8.9–10.3)
Chloride: 105 mmol/L (ref 98–111)
Creatinine: 1 mg/dL (ref 0.61–1.24)
GFR, Estimated: >60 mL/min (ref >60)
Glucose, Bld: 102 mg/dL — ABNORMAL HIGH (ref 70–99)
Potassium: 4.1 mmol/L (ref 3.5–5.1)
Sodium: 137 mmol/L (ref 135–145)
Total Bilirubin: 0.7 mg/dL (ref 0.3–1.2)
Total Protein: 7.1 g/dL (ref 6.5–8.1)

## 2023-02-07 NOTE — Progress Notes (Signed)
[name]    HEMATOLOGY/ONCOLOGY CLINIC NOTE  Date of Service: 02/07/23   Patient Care Team: Lenell Antu, DO as PCP - General (Family Medicine)  CHIEF COMPLAINTS/PURPOSE OF CONSULTATION:   Follow-up for continued evaluation and management of remote history of Hodgkin's lymphoma  HISTORY OF PRESENTING ILLNESS:   Marcus [name] is a wonderful 52 y.o. male who has been referred to [place] by Dr .Conley Rolls, Rachelle Hora, DO for evaluation and continued management of Hodgkin's lymphoma.  Patient has previously been seen by Dr. Aggie Moats MD at [facility] in [facility] for management of his Hodgkin's lymphoma and recently moved to [facility] in January 2018 and wanted to continue his follow-up for Hodgkin's lymphoma here.  Patient presented with bilateral cervical lymphadenopathy first noted in December 2013 and then evaluated with a CT of the neck in January 2014. He notes that he also had some night sweats and some fatigue along with the lymphadenopathy. Patient had a lymph node biopsy of the neck initially which was nondiagnostic.    patient had a PET CT scan on 07/11/2012 which showed extensive nodal hypermetabolic lesion throughout the outer neck bilaterally and mediastinum with SUV up to 11.6   Patient required 2 biopsies of his mediastinal nodes by interventional radiology to make a diagnosis of Hodgkin's lymphoma nodular sclerosis type in March 2014.   patient was treated with ABVD for 6 cycles February through September 2014  He has been in complete remission since then. His last clinic visit with medical oncology was in August 2017 when he had no clinical or lab findings suggestive of disease recurrence. He missed a follow-up in February 2018 since he was in the process of moving to [place].  Patient notes that he is enjoying his job in [place]. He notes no overt new enlarged lymph nodes. No fevers no chills no night sweats no unexpected weight loss. No chest pain no  shortness of breath. No abdominal pain or distention. No new skin rashes.  Interval History:  Marcus [name] is here for his 1 year follow-up for remote history of Hodgkin's lymphoma. He was last seen by me on 02/06/2022 and endorsed intermittent issues with acid reflux, but was otherwise doing well overall with no new medical concerns.   Today, he reports that he has been doing well overall over the last year. Patient reports back pain likely related to his core muslces which has improved with physical therapy. He takes Aleve daily to manage back pain.  He denies any fever, chills, night sweats, fatigue, swallowing issues, difficulty breathing, SOB, chest pain, abdominal pain/distention, change in bowel habits, new bone pain or leg swelling.   He reports that a recent lipid panel showed increased LDL and cholesterol levels. He is working towards improving his eating habits and activity level.   Patient does have GERD and takes Pantoprazole as needed. He denies any precancerous findings related to chronic reflux. He does regularly take a multivitamin.   MEDICAL HISTORY:  #1 history of Hodgkin's lymphoma diagnosed in April 2014 status post ABVD 6 cycles completed in September 2014. Has remained in complete remission since then. #2 obesity #3 hypertension  SURGICAL HISTORY: Past Surgical History:  Procedure Laterality Date   COLONOSCOPY  2012   in [place]-normal exam per pt   ganglion cyst removal     LYMPH NODE BIOPSY     neck    SOCIAL HISTORY: Social History   Socioeconomic History   Marital status: Married  Spouse name: Not on file   Number of children: 1   Years of education: Not on file   Highest education level: Not on file  Occupational History   Occupation: Medical Tech  Tobacco Use   Smoking status: Former    Current packs/day: 0.00    Average packs/day: 0.5 packs/day for 12.0 years (6.0 ttl pk-yrs)    Types: Cigarettes    Start date: 61    Quit date: 2010     Years since quitting: 14.7   Smokeless tobacco: Never  Vaping Use   Vaping status: Never Used  Substance and Sexual Activity   Alcohol use: Not Currently    Alcohol/week: 10.0 standard drinks of alcohol    Types: 10 Standard drinks or equivalent per week    Comment: 8-10 drinks per week per pt   Drug use: No   Sexual activity: Yes  Other Topics Concern   Not on file  Social History Narrative   ** Merged History Encounter **       Social Determinants of Health   Financial Resource Strain: Not on file  Food Insecurity: Not on file  Transportation Needs: Not on file  Physical Activity: Not on file  Stress: Not on file  Social Connections: Not on file  Intimate Partner Violence: Not on file  Works as a Advice worker. Married. Has one son Recently moved to [facility] from [place] in January 2018. Remote history of smoking quit in 2011. Social alcohol use Denies any other drug use  FAMILY HISTORY:  Father had nephrectomy for kidney cancer in 61 Mother had non-Hodgkin's lymphoma followed by Hodgkin's lymphoma and eventually died from adenocarcinoma of unknown primary.   ALLERGIES:  has No Known Allergies.  MEDICATIONS:  Current Outpatient Medications  Medication Sig Dispense Refill   atorvastatin (LIPITOR) 20 MG tablet Take 1 tablet (20 mg total) by mouth daily. 90 tablet 3   ibuprofen (ADVIL) 200 MG tablet Take 200 mg by mouth every 6 (six) hours as needed.     pantoprazole (PROTONIX) 40 MG tablet Take 1 tablet (40 mg total) by mouth 2 (two) times daily before a meal. 180 tablet 1   No current facility-administered medications for this visit.    REVIEW OF SYSTEMS:    10 Point review of Systems was done is negative except as noted above.   PHYSICAL EXAMINATION: ECOG PERFORMANCE STATUS: 0 - Asymptomatic  . Vitals:   02/07/23 1322  BP: (!) 130/95  Pulse: 80  Resp: 18  Temp: 98.3 F (36.8 C)  SpO2: 99%    Filed Weights    02/07/23 1322  Weight: 250 lb 9.6 oz (113.7 kg)    .Body mass index is 33.99 kg/m.    GENERAL:alert, in no acute distress and comfortable SKIN: no acute rashes, no significant lesions EYES: conjunctiva are pink and non-injected, sclera anicteric OROPHARYNX: MMM, no exudates, no oropharyngeal erythema or ulceration NECK: supple, no JVD LYMPH:  no palpable lymphadenopathy in the cervical, axillary or inguinal regions LUNGS: clear to auscultation b/l with normal respiratory effort HEART: regular rate & rhythm ABDOMEN:  normoactive bowel sounds , non tender, not distended. Extremity: no pedal edema PSYCH: alert & oriented x 3 with fluent speech NEURO: no focal motor/sensory deficits   LABORATORY DATA:  I have reviewed the data as listed  .    Latest Ref Rng & Units 02/07/2023    1:07 PM 02/06/2022    8:53 AM 02/08/2021    9:39  AM  CBC  WBC 4.0 - 10.5 K/uL 4.5  3.4  3.6   Hemoglobin 13.0 - 17.0 g/dL 56.2  13.0  86.5   Hematocrit 39.0 - 52.0 % 42.5  43.6  41.6   Platelets 150 - 400 K/uL 168  208  217    . CBC    Component Value Date/Time   WBC 3.4 (L) 02/06/2022 0853   WBC 3.4 (L) 01/21/2020 0833   RBC 4.95 02/06/2022 0853   HGB 15.2 02/06/2022 0853   HCT 43.6 02/06/2022 0853   PLT 208 02/06/2022 0853   MCV 88.1 02/06/2022 0853   MCH 30.7 02/06/2022 0853   MCHC 34.9 02/06/2022 0853   RDW 12.6 02/06/2022 0853   LYMPHSABS 1.2 02/06/2022 0853   MONOABS 0.5 02/06/2022 0853   EOSABS 0.2 02/06/2022 0853   BASOSABS 0.1 02/06/2022 0853   .    Latest Ref Rng & Units 02/07/2023    1:07 PM 02/06/2022    8:53 AM 02/08/2021    9:39 AM  CMP  Glucose 70 - 99 mg/dL 784  696  295   BUN 6 - 20 mg/dL 18  15  12    Creatinine 0.61 - 1.24 mg/dL 2.84  1.32  4.40   Sodium 135 - 145 mmol/L 137  136  139   Potassium 3.5 - 5.1 mmol/L 4.1  4.7  4.4   Chloride 98 - 111 mmol/L 105  105  107   CO2 22 - 32 mmol/L 25  27  24    Calcium 8.9 - 10.3 mg/dL 10.2  72.5  36.6   Total Protein 6.5  - 8.1 g/dL 7.1  7.1  6.8   Total Bilirubin 0.3 - 1.2 mg/dL 0.7  0.4  0.5   Alkaline Phos 38 - 126 U/L 65  62  63   AST 15 - 41 U/L 25  25  25    ALT 0 - 44 U/L 30  33  36    Sed rate 3 Ionized calcium 5.7 PTHRP <2 PTH, intact and calcium Order: [id number] Status: Final result     Visible to patient: Yes (seen)     Next appt: 02/12/2024 at 08:30 AM in Oncology ([facility])     Dx: Hypercalcemia; Persistent cough for 3...   0 Result Notes    Component Ref Range & Units 3 yr ago  PTH 15 - 65 pg/mL 49  Calcium, Total (PTH) 8.7 - 10.2 mg/dL 95.6 High      RADIOGRAPHIC STUDIES: I have personally reviewed the radiological images as listed and agreed with the findings in the report. No results found.   ASSESSMENT & PLAN:   52 y.o. male with   1) h/o Classical Hodgkins Lymphoma -Nodular sclerosis type Stage IIB  Diagnosed in January 2014 Treated with ABVD 6 cycles completed in September 2014. Patient has remained in complete remission since then.  2) HTN - f/u with PCP  3) Obesity - counseling and active lifestyle and appropriate diet.  4) Hypercalcemia    PLAN: -patient is 10 years out from Hodgkin's lymphoma. Risk or recurrence back to general population -Discussed lab results on 02/07/23 in detail with patient. CBC normal showed WBC of 4.5K, hemoglobin of 15.0, and platelets of 168K. -CMP shows calcium level elevated at 11.1 mg/dL, but otherwise stable. Sed rate is 2 -high calcium is not an acute new finding. Fluctuation of calcium level may be due to hydration status.  -Patient has no lab or clinical symptoms suggestive of  Hodgkins lymphoma progression at this time. -discussed importance of long term risk reduction. Discussed goal of optimizing food and lifestyle choices and optimizing cardiac health. Discussed options of a healthy nutritional plan in detail including consuming healthy carbohydrates, managing protein intake, consuming meat with less saturated  fats, using oils with monounsaturated fatty acids, optimizing omega-3 levels from vegetable-based sources or flaxseed oil, and minimizing trans fats.  -advised patient to minimize NSAID medications including Aleve as they may worsen acid reflux -recommend patient to stay UTD with age-appropriate vaccinations including influenza and pneumonia -would recommend patient discuss with PCP and PCP consider referral to endocrinology for further evaluation of hypercalcemia. -will set up pt for 1 year follow up with [place] until patient finds a new PCP.  FOLLOW-UP: Referral to endocrinology for evaluation of hypercalcemia RTC with Dr Candise Che with labs in 12 months  The total time spent in the appointment was 30 minutes* .  All of the patient's questions were answered with apparent satisfaction. The patient knows to call the clinic with any problems, questions or concerns.   Wyvonnia Lora MD MS AAHIVMS [facility] Hematology/Oncology Physician [facility]  .*Total Encounter Time as defined by the Centers for Medicare and Medicaid Services includes, in addition to the face-to-face time of a patient visit (documented in the note above) non-face-to-face time: obtaining and reviewing outside history, ordering and reviewing medications, tests or procedures, care coordination (communications with other health care professionals or caregivers) and documentation in the medical record.    I,Mitra Faeizi,acting as a Neurosurgeon for Wyvonnia Lora, MD.,have documented all relevant documentation on the behalf of Wyvonnia Lora, MD,as directed by  Wyvonnia Lora, MD while in the presence of Wyvonnia Lora, MD.  .I have reviewed the above documentation for accuracy and completeness, and I agree with the above. Johney Maine MD

## 2023-02-08 LAB — CALCIUM, IONIZED: Calcium, Ionized, Serum: 6 mg/dL — ABNORMAL HIGH (ref 4.5–5.6)

## 2023-02-15 LAB — PTH-RELATED PEPTIDE: PTH-related peptide: <2 pmol/L

## 2023-06-04 ENCOUNTER — Other Ambulatory Visit: Payer: Self-pay

## 2023-06-07 ENCOUNTER — Other Ambulatory Visit: Payer: 59

## 2023-06-13 ENCOUNTER — Ambulatory Visit: Payer: 59 | Admitting: "Endocrinology

## 2023-06-13 LAB — RENAL FUNCTION PANEL
Albumin: 4.5 g/dL (ref 3.6–5.1)
BUN: 20 mg/dL (ref 7–25)
CO2: 23 mmol/L (ref 20–32)
Calcium: 10.7 mg/dL — ABNORMAL HIGH (ref 8.6–10.3)
Chloride: 105 mmol/L (ref 98–110)
Creat: 1.05 mg/dL (ref 0.70–1.30)
Glucose, Bld: 89 mg/dL (ref 65–99)
Phosphorus: 2.6 mg/dL (ref 2.5–4.5)
Potassium: 4.4 mmol/L (ref 3.5–5.3)
Sodium: 137 mmol/L (ref 135–146)

## 2023-06-13 LAB — VITAMIN D 1,25 DIHYDROXY
Vitamin D 1, 25 (OH)2 Total: 79 pg/mL — ABNORMAL HIGH (ref 18–72)
Vitamin D2 1, 25 (OH)2: <8 pg/mL
Vitamin D3 1, 25 (OH)2: 79 pg/mL

## 2023-06-13 LAB — PTH, INTACT AND CALCIUM
Calcium: 10.7 mg/dL — ABNORMAL HIGH (ref 8.6–10.3)
PTH: 147 pg/mL — ABNORMAL HIGH (ref 16–77)

## 2023-06-13 LAB — MAGNESIUM: Magnesium: 2 mg/dL (ref 1.5–2.5)

## 2023-06-13 LAB — VITAMIN D 25 HYDROXY (VIT D DEFICIENCY, FRACTURES): Vit D, 25-Hydroxy: 22 ng/mL — ABNORMAL LOW (ref 30–100)

## 2023-06-21 ENCOUNTER — Ambulatory Visit: Payer: 59 | Admitting: "Endocrinology

## 2023-06-21 ENCOUNTER — Encounter: Payer: Self-pay | Admitting: "Endocrinology

## 2023-06-21 DIAGNOSIS — E559 Vitamin D deficiency, unspecified: Secondary | ICD-10-CM | POA: Diagnosis not present

## 2023-06-21 DIAGNOSIS — E210 Primary hyperparathyroidism: Secondary | ICD-10-CM | POA: Diagnosis not present

## 2023-06-21 NOTE — Progress Notes (Signed)
Outpatient Endocrinology Note Marcus Casey, MD    Marcus [name] Feb 16, 1971 [id number]  Referring Provider: Johney Maine, MD Primary Care Provider: Lenell Antu, DO Reason for consultation: Subjective   Assessment & Plan  Diagnoses and all orders for this visit:  Hypercalcemia -     VITAMIN D 25 Hydroxy (Vit-D Deficiency, Fractures) -     Renal function panel -     PTH, intact and calcium -     Vitamin D 1,25 dihydroxy  Primary hyperparathyroidism (HCC) -     VITAMIN D 25 Hydroxy (Vit-D Deficiency, Fractures) -     Renal function panel -     PTH, intact and calcium -     Vitamin D 1,25 dihydroxy  Vitamin D deficiency    Remote history of Hodgkin's lymphoma status post chemo Follows with oncologist Ionized calcium has been consistently elevated at least since 01/2020, last at 6 in 01/2023 Current corrected calcium is normal at 10.3, with elevated PTH at 147, relatively asymptomatic No prior PTH elevation noted before this Vitamin D low at 22 Patient is currently taking a multivitamin with 1600 IU of vitamin D and 200 mg calcium daily, instructed to add 2000 units of vitamin D over-the-counter and repeat labs in 3 months to see if PTH improves If PTH remains elevated after vitamin D improvement, will follow-up with 24-hour urine collection and bone density  It is important to have good hydration to prevent severe hypercalcemia:  - 8 eight ounce glasses of fluids per day.  - low threshold for ER visit for IV fluids if having     nausea/vomiting/diarrhea and cannot keep well hydrated.   Return in about 3 months (around 09/19/2023) for visit + labs before next visit.   I have reviewed current medications, nurse's notes, allergies, vital signs, past medical and surgical history, family medical history, and social history for this encounter. Counseled patient on symptoms, examination findings, lab findings, imaging results, treatment decisions and monitoring and  prognosis. The patient understood the recommendations and agrees with the treatment plan. All questions regarding treatment plan were fully answered.  Marcus [place], MD  06/21/23   History of Present Illness HPI   Marcus [name] is a 53 y.o. male referred by Dr. Candise Che for evaluation and management of hypercalcemia.    He was found to have normal serum calcium of 10.3 mg/dL in with a high intact PTH level of 147pg/ml.  Ionized calcium has been consistently elevated at least since 01/2020.  Takes MVI with 1600 IU Vit D and 200mg  calcium daily  Takes cheese, ice cream rarely  History of Hodgkin's lymphoma around 2014 - s/p chemo   Patient denies a history of kidney stones He  current hematuria No polyuria No nocturia No thirst No renal failure No anorexia No abdominal pain No heartburn No constipation No nausea or vomiting No history of peptic ulcer disease No depression No confusion No excessive fatigue No fracture No osteoporosis No headaches No numbness No tingling No  He a history of taking chronic lithium No He a recent history of thiazide diuretic intake No  He family history of renal stones/hypercalcemia No a personal history of MEN syndromes/medullary thyroid cancer/ pheochromocytoma No  Physical Exam  BP 136/80   Pulse 81   Ht 6' (1.829 m)   Wt 253 lb 9.6 oz (115 kg)   SpO2 96%   BMI 34.39 kg/m    Constitutional: well developed, well nourished Head: normocephalic, atraumatic  Eyes: sclera anicteric, no redness Neck: supple Lungs: normal respiratory effort Neurology: alert and oriented Skin: dry, no appreciable rashes Musculoskeletal: no appreciable defects Psychiatric: normal mood and affect   Current Medications Patient's Medications  New Prescriptions   No medications on file  Previous Medications   ATORVASTATIN (LIPITOR) 20 MG TABLET    Take 1 tablet (20 mg total) by mouth daily.   IBUPROFEN (ADVIL) 200 MG TABLET    Take 200 mg by mouth  every 6 (six) hours as needed.   PANTOPRAZOLE (PROTONIX) 40 MG TABLET    Take 1 tablet (40 mg total) by mouth 2 (two) times daily before a meal.  Modified Medications   No medications on file  Discontinued Medications   No medications on file    Allergies No Known Allergies  Past Medical History Past Medical History:  Diagnosis Date   Diverticulosis    Esophageal ulceration 12/2020   GERD (gastroesophageal reflux disease)    Heart murmur    Hodgkin's disease (HCC)    In remission.   Hodgkin's lymphoma (HCC) 2014   treated with chemotherapy   Hypertension    Internal hemorrhoids    Palpitations    Tubular adenoma of colon     Past Surgical History Past Surgical History:  Procedure Laterality Date   COLONOSCOPY  2012   in [place]-normal exam per pt   ganglion cyst removal     LYMPH NODE BIOPSY     neck    Family History family history includes Colon cancer (age of onset: 49) in his mother; Colon polyps in his father; Diabetes in his maternal grandmother; Heart Problems in his father; Hodgkin's lymphoma in his maternal grandfather and mother; Kidney cancer in his father; Non-Hodgkin's lymphoma in his mother; Other in his mother; Skin cancer in his sister.  Social History Social History   Socioeconomic History   Marital status: Married    Spouse name: Not on file   Number of children: 1   Years of education: Not on file   Highest education level: Not on file  Occupational History   Occupation: Medical Tech  Tobacco Use   Smoking status: Former    Current packs/day: 0.00    Average packs/day: 0.5 packs/day for 12.0 years (6.0 ttl pk-yrs)    Types: Cigarettes    Start date: 20    Quit date: 2010    Years since quitting: 15.0   Smokeless tobacco: Never  Vaping Use   Vaping status: Never Used  Substance and Sexual Activity   Alcohol use: Not Currently    Alcohol/week: 10.0 standard drinks of alcohol    Types: 10 Standard drinks or equivalent per week     Comment: 8-10 drinks per week per pt   Drug use: No   Sexual activity: Yes  Other Topics Concern   Not on file  Social History Narrative   ** Merged History Encounter **       Social Drivers of Corporate investment banker Strain: Not on file  Food Insecurity: Not on file  Transportation Needs: Not on file  Physical Activity: Not on file  Stress: Not on file  Social Connections: Not on file  Intimate Partner Violence: Not on file    No results found for: "CHOL" No results found for: "HDL" No results found for: "LDLCALC" No results found for: "TRIG" No results found for: "CHOLHDL" Lab Results  Component Value Date   CREATININE 1.05 06/07/2023   No results found for: "GFR"  Component Value Date/Time   NA 137 06/07/2023 0901   K 4.4 06/07/2023 0901   CL 105 06/07/2023 0901   CO2 23 06/07/2023 0901   GLUCOSE 89 06/07/2023 0901   BUN 20 06/07/2023 0901   CREATININE 1.05 06/07/2023 0901   CALCIUM 10.7 (H) 06/07/2023 0901   CALCIUM 10.7 (H) 06/07/2023 0901   CALCIUM 10.7 (H) 01/28/2020 1146   PROT 7.1 02/07/2023 1307   ALBUMIN 4.4 02/07/2023 1307   AST 25 02/07/2023 1307   ALT 30 02/07/2023 1307   ALKPHOS 65 02/07/2023 1307   BILITOT 0.7 02/07/2023 1307   GFRNONAA >60 02/07/2023 1307   GFRAA >60 01/28/2020 1146      Latest Ref Rng & Units 06/07/2023    9:01 AM 02/07/2023    1:07 PM 02/06/2022    8:53 AM  BMP  Glucose 65 - 99 mg/dL 89  562  130   BUN 7 - 25 mg/dL 20  18  15    Creatinine 0.70 - 1.30 mg/dL 8.65  7.84  6.96   BUN/Creat Ratio 6 - 22 (calc) SEE NOTE:     Sodium 135 - 146 mmol/L 137  137  136   Potassium 3.5 - 5.3 mmol/L 4.4  4.1  4.7   Chloride 98 - 110 mmol/L 105  105  105   CO2 20 - 32 mmol/L 23  25  27    Calcium 8.6 - 10.3 mg/dL 8.6 - 29.5 mg/dL 28.4    13.2  44.0  10.2        Component Value Date/Time   WBC 4.5 02/07/2023 1307   WBC 3.4 (L) 01/21/2020 0833   RBC 4.84 02/07/2023 1307   HGB 15.0 02/07/2023 1307   HCT 42.5 02/07/2023 1307    PLT 168 02/07/2023 1307   MCV 87.8 02/07/2023 1307   MCH 31.0 02/07/2023 1307   MCHC 35.3 02/07/2023 1307   RDW 12.3 02/07/2023 1307   LYMPHSABS 0.5 (L) 02/07/2023 1307   MONOABS 0.5 02/07/2023 1307   EOSABS 0.1 02/07/2023 1307   BASOSABS 0.1 02/07/2023 1307   No results found for: "TSH", "FREET4"       Parts of this note may have been dictated using voice recognition software. There may be variances in spelling and vocabulary which are unintentional. Not all errors are proofread. Please notify the Thereasa Parkin if any discrepancies are noted or if the meaning of any statement is not clear.

## 2023-06-21 NOTE — Patient Instructions (Signed)
-   It is important to have good hydration to prevent severe hypercalcemia:  - 8 eight ounce glasses of fluids per day.  - low threshold for ER visit for IV fluids if having     nausea/vomiting/diarrhea and cannot keep well hydrated.

## 2023-09-25 ENCOUNTER — Other Ambulatory Visit: Payer: Self-pay

## 2023-10-01 ENCOUNTER — Other Ambulatory Visit: Payer: 59

## 2023-10-08 ENCOUNTER — Ambulatory Visit: Payer: 59 | Admitting: "Endocrinology

## 2023-11-15 ENCOUNTER — Ambulatory Visit: Admitting: "Endocrinology

## 2023-11-27 ENCOUNTER — Ambulatory Visit: Admitting: "Endocrinology

## 2023-11-27 ENCOUNTER — Encounter: Payer: Self-pay | Admitting: "Endocrinology

## 2023-11-27 VITALS — BP 122/84 | HR 85 | Ht 72.0 in | Wt 252.0 lb

## 2023-11-27 DIAGNOSIS — E210 Primary hyperparathyroidism: Secondary | ICD-10-CM

## 2023-11-27 DIAGNOSIS — E559 Vitamin D deficiency, unspecified: Secondary | ICD-10-CM | POA: Diagnosis not present

## 2023-11-27 NOTE — Patient Instructions (Signed)
  You should collect every drop of urine during each 24 hour period. It does not matter how much or little urine is passed each time, as long as every drop is collected.   Begin the urine collection in the morning after you wake up.  The first time your empty your bladder, flush it down the toilet because that was yesterday's urine. Write down the exact time (eg, 6:15 AM). You will begin the urine collection at this time.  Every other time you urinate for the rest of the day and through the night, the urine goes into the jug.  Also the first time you empty your bladder the next morning, that goes into the jug.  This should be within 10 minutes before or after the time of the first morning void on the first day (which was flushed).  Bring the jug back to the lab when you have completed this.    If you need to urinate one hour before the final collection time, drink a full glass of water so that you can void again at the appropriate time. If you have to urinate 20 minutes before, try to hold the urine until the proper time.  Please note the exact time of the final collection, even if it is not the same time as when collection began on day one.    Store the jug in the refrigerator.  If you need to have a bowel movement, any urine passed with the bowel movement should be collected. Try not to include feces with the urine collection. If feces does get mixed in, do not try to remove the feces from the urine collection bottle.

## 2023-11-27 NOTE — Progress Notes (Signed)
 Outpatient Endocrinology Note Marcus Birmingham, MD    Marcus [name] 01/02/1971 [id number]  Referring Provider: Ladora Jaynie SQUIBB, DO Primary Care Provider: Ladora Jaynie SQUIBB, DO Reason for consultation: Subjective   Assessment & Plan  Diagnoses and all orders for this visit:  Primary hyperparathyroidism (HCC)  Hypercalcemia  Vitamin D  deficiency     Remote history of Hodgkin's lymphoma status post chemo Follows with oncologist Ionized calcium  has been consistently elevated at least since 01/2020, last at 6 in 01/2023 06/07/23 corrected calcium  is normal at 10.3, with elevated PTH at 147, relatively asymptomatic with Vitamin D  low at 22 No prior PTH elevation noted before this Patient is currently taking a multivitamin with 1600 IU of vitamin D  and 200 mg calcium  daily, and 2000 units of vitamin D  over-the-counter every day Labs not done, to be done today If PTH remains elevated after vitamin D  improvement, will follow-up with 24-hour urine collection and bone density It is important to have good hydration to prevent severe hypercalcemia:  - 8 eight ounce glasses of fluids per day.  - low threshold for ER visit for IV fluids if having     nausea/vomiting/diarrhea and cannot keep well hydrated.   Return in about 4 months (around 03/29/2024) for visit + labs before next visit, pick up container for 24 hour urine today.   I have reviewed current medications, nurse's notes, allergies, vital signs, past medical and surgical history, family medical history, and social history for this encounter. Counseled patient on symptoms, examination findings, lab findings, imaging results, treatment decisions and monitoring and prognosis. The patient understood the recommendations and agrees with the treatment plan. All questions regarding treatment plan were fully answered.  Marcus Birmingham, MD  11/27/23   History of Present Illness HPI   Marcus [name] is a 53 y.o. male referred by Dr. Ladora for  follow up of hypercalcemia.    Denies abdominal pain/nausea/vomiting/GERD/constipation Denies confusion No falls/fractures/bone pains  Not taking any Calcium  Taking Vit D 2000 international units  every day   He was found to have normal serum calcium  of 10.3 mg/dL in with a high intact PTH level of 147pg/ml.  Ionized calcium  has been consistently elevated at least since 01/2020.  Takes MVI with 1600 IU Vit D and 200mg  calcium  daily  Takes cheese, ice cream rarely  History of Hodgkin's lymphoma around 2014 - s/p chemo   Patient denies a history of kidney stones He  current hematuria No polyuria No nocturia No thirst No renal failure No anorexia No abdominal pain No heartburn No constipation No nausea or vomiting No history of peptic ulcer disease No depression No confusion No excessive fatigue No fracture No osteoporosis No headaches No numbness No tingling No  He a history of taking chronic lithium No He a recent history of thiazide diuretic intake No  He family history of renal stones/hypercalcemia No a personal history of MEN syndromes/medullary thyroid cancer/ pheochromocytoma No  Physical Exam  BP 122/84   Pulse 85   Ht 6' (1.829 m)   Wt 252 lb (114.3 kg)   SpO2 93%   BMI 34.18 kg/m    Constitutional: well developed, well nourished Head: normocephalic, atraumatic Eyes: sclera anicteric, no redness Neck: supple Lungs: normal respiratory effort Neurology: alert and oriented Skin: dry, no appreciable rashes Musculoskeletal: no appreciable defects Psychiatric: normal mood and affect   Current Medications Patient's Medications  New Prescriptions   No medications on file  Previous Medications   ATORVASTATIN  (  LIPITOR) 20 MG TABLET    Take 1 tablet (20 mg total) by mouth daily.   IBUPROFEN (ADVIL) 200 MG TABLET    Take 200 mg by mouth every 6 (six) hours as needed.   PANTOPRAZOLE  (PROTONIX ) 40 MG TABLET    Take 1 tablet (40 mg total) by mouth 2 (two)  times daily before a meal.  Modified Medications   No medications on file  Discontinued Medications   No medications on file    Allergies No Known Allergies  Past Medical History Past Medical History:  Diagnosis Date   Diverticulosis    Esophageal ulceration 12/2020   GERD (gastroesophageal reflux disease)    Heart murmur    Hodgkin's disease (HCC)    In remission.   Hodgkin's lymphoma (HCC) 2014   treated with chemotherapy   Hypertension    Internal hemorrhoids    Palpitations    Tubular adenoma of colon     Past Surgical History Past Surgical History:  Procedure Laterality Date   COLONOSCOPY  2012   in [place]-normal exam per pt   ganglion cyst removal     LYMPH NODE BIOPSY     neck    Family History family history includes Colon cancer (age of onset: 58) in his mother; Colon polyps in his father; Diabetes in his maternal grandmother; Heart Problems in his father; Hodgkin's lymphoma in his maternal grandfather and mother; Kidney cancer in his father; Non-Hodgkin's lymphoma in his mother; Other in his mother; Skin cancer in his sister.  Social History Social History   Socioeconomic History   Marital status: Married    Spouse name: Not on file   Number of children: 1   Years of education: Not on file   Highest education level: Not on file  Occupational History   Occupation: Medical Tech  Tobacco Use   Smoking status: Former    Current packs/day: 0.00    Average packs/day: 0.5 packs/day for 12.0 years (6.0 ttl pk-yrs)    Types: Cigarettes    Start date: 101    Quit date: 2010    Years since quitting: 15.5   Smokeless tobacco: Never  Vaping Use   Vaping status: Never Used  Substance and Sexual Activity   Alcohol use: Not Currently    Alcohol/week: 10.0 standard drinks of alcohol    Types: 10 Standard drinks or equivalent per week    Comment: 8-10 drinks per week per pt   Drug use: No   Sexual activity: Yes  Other Topics Concern   Not on file  Social  History Narrative   ** Merged History Encounter **       Social Drivers of Health   Financial Resource Strain: Not on file  Food Insecurity: Not on file  Transportation Needs: Not on file  Physical Activity: Not on file  Stress: Not on file  Social Connections: Not on file  Intimate Partner Violence: Not on file    No results found for: CHOL No results found for: HDL No results found for: LDLCALC No results found for: TRIG No results found for: [facility] Lab Results  Component Value Date   CREATININE 1.05 06/07/2023   No results found for: GFR    Component Value Date/Time   NA 137 06/07/2023 0901   K 4.4 06/07/2023 0901   CL 105 06/07/2023 0901   CO2 23 06/07/2023 0901   GLUCOSE 89 06/07/2023 0901   BUN 20 06/07/2023 0901   CREATININE 1.05 06/07/2023 0901  CALCIUM  10.7 (H) 06/07/2023 0901   CALCIUM  10.7 (H) 06/07/2023 0901   CALCIUM  10.7 (H) 01/28/2020 1146   PROT 7.1 02/07/2023 1307   ALBUMIN 4.4 02/07/2023 1307   AST 25 02/07/2023 1307   ALT 30 02/07/2023 1307   ALKPHOS 65 02/07/2023 1307   BILITOT 0.7 02/07/2023 1307   GFRNONAA >60 02/07/2023 1307   GFRAA >60 01/28/2020 1146      Latest Ref Rng & Units 06/07/2023    9:01 AM 02/07/2023    1:07 PM 02/06/2022    8:53 AM  BMP  Glucose 65 - 99 mg/dL 89  897  895   BUN 7 - 25 mg/dL 20  18  15    Creatinine 0.70 - 1.30 mg/dL 8.94  8.99  8.90   BUN/Creat Ratio 6 - 22 (calc) SEE NOTE:     Sodium 135 - 146 mmol/L 137  137  136   Potassium 3.5 - 5.3 mmol/L 4.4  4.1  4.7   Chloride 98 - 110 mmol/L 105  105  105   CO2 20 - 32 mmol/L 23  25  27    Calcium  8.6 - 10.3 mg/dL 8.6 - 89.6 mg/dL 89.2    89.2  88.8  89.5        Component Value Date/Time   WBC 4.5 02/07/2023 1307   WBC 3.4 (L) 01/21/2020 0833   RBC 4.84 02/07/2023 1307   HGB 15.0 02/07/2023 1307   HCT 42.5 02/07/2023 1307   PLT 168 02/07/2023 1307   MCV 87.8 02/07/2023 1307   MCH 31.0 02/07/2023 1307   MCHC 35.3 02/07/2023 1307   RDW 12.3  02/07/2023 1307   LYMPHSABS 0.5 (L) 02/07/2023 1307   MONOABS 0.5 02/07/2023 1307   EOSABS 0.1 02/07/2023 1307   BASOSABS 0.1 02/07/2023 1307   No results found for: TSH, FREET4       Parts of this note may have been dictated using voice recognition software. There may be variances in spelling and vocabulary which are unintentional. Not all errors are proofread. Please notify the dino if any discrepancies are noted or if the meaning of any statement is not clear.

## 2023-11-28 ENCOUNTER — Ambulatory Visit: Payer: Self-pay | Admitting: "Endocrinology

## 2023-11-28 DIAGNOSIS — E210 Primary hyperparathyroidism: Secondary | ICD-10-CM

## 2023-12-01 LAB — VITAMIN D 25 HYDROXY (VIT D DEFICIENCY, FRACTURES): Vit D, 25-Hydroxy: 36 ng/mL (ref 30–100)

## 2023-12-01 LAB — RENAL FUNCTION PANEL
Albumin: 4.4 g/dL (ref 3.6–5.1)
BUN: 18 mg/dL (ref 7–25)
CO2: 29 mmol/L (ref 20–32)
Calcium: 10.7 mg/dL — ABNORMAL HIGH (ref 8.6–10.3)
Chloride: 105 mmol/L (ref 98–110)
Creat: 0.88 mg/dL (ref 0.70–1.30)
Glucose, Bld: 96 mg/dL (ref 65–99)
Phosphorus: 2.5 mg/dL (ref 2.5–4.5)
Potassium: 4.7 mmol/L (ref 3.5–5.3)
Sodium: 138 mmol/L (ref 135–146)

## 2023-12-01 LAB — VITAMIN D 1,25 DIHYDROXY
Vitamin D 1, 25 (OH)2 Total: 66 pg/mL (ref 18–72)
Vitamin D2 1, 25 (OH)2: <8 pg/mL
Vitamin D3 1, 25 (OH)2: 66 pg/mL

## 2023-12-01 LAB — PTH, INTACT AND CALCIUM
Calcium: 10.7 mg/dL — ABNORMAL HIGH (ref 8.6–10.3)
PTH: 136 pg/mL — ABNORMAL HIGH (ref 16–77)

## 2024-02-12 ENCOUNTER — Other Ambulatory Visit: Payer: 59

## 2024-02-12 ENCOUNTER — Ambulatory Visit: Payer: 59 | Admitting: Hematology

## 2024-02-18 ENCOUNTER — Other Ambulatory Visit: Payer: Self-pay

## 2024-02-18 DIAGNOSIS — C8118 Nodular sclerosis classical Hodgkin lymphoma, lymph nodes of multiple sites: Secondary | ICD-10-CM

## 2024-02-19 ENCOUNTER — Inpatient Hospital Stay: Attending: Hematology

## 2024-02-19 ENCOUNTER — Inpatient Hospital Stay: Admitting: Hematology

## 2024-02-19 VITALS — BP 134/89 | HR 81 | Temp 97.7°F | Resp 20 | Wt 252.8 lb

## 2024-02-19 DIAGNOSIS — Z87891 Personal history of nicotine dependence: Secondary | ICD-10-CM | POA: Insufficient documentation

## 2024-02-19 DIAGNOSIS — Z79899 Other long term (current) drug therapy: Secondary | ICD-10-CM | POA: Insufficient documentation

## 2024-02-19 DIAGNOSIS — I10 Essential (primary) hypertension: Secondary | ICD-10-CM | POA: Insufficient documentation

## 2024-02-19 DIAGNOSIS — Z8579 Personal history of other malignant neoplasms of lymphoid, hematopoietic and related tissues: Secondary | ICD-10-CM | POA: Diagnosis present

## 2024-02-19 DIAGNOSIS — E669 Obesity, unspecified: Secondary | ICD-10-CM | POA: Insufficient documentation

## 2024-02-19 DIAGNOSIS — C8118 Nodular sclerosis classical Hodgkin lymphoma, lymph nodes of multiple sites: Secondary | ICD-10-CM | POA: Diagnosis not present

## 2024-02-19 DIAGNOSIS — Z08 Encounter for follow-up examination after completed treatment for malignant neoplasm: Secondary | ICD-10-CM | POA: Insufficient documentation

## 2024-02-19 LAB — CBC WITH DIFFERENTIAL (CANCER CENTER ONLY)
Abs Immature Granulocytes: 0.02 K/uL (ref 0.00–0.07)
Basophils Absolute: 0.1 K/uL (ref 0.0–0.1)
Basophils Relative: 1 %
Eosinophils Absolute: 0.2 K/uL (ref 0.0–0.5)
Eosinophils Relative: 4 %
HCT: 40.9 % (ref 39.0–52.0)
Hemoglobin: 14.6 g/dL (ref 13.0–17.0)
Immature Granulocytes: 1 %
Lymphocytes Relative: 27 %
Lymphs Abs: 1.2 K/uL (ref 0.7–4.0)
MCH: 30.9 pg (ref 26.0–34.0)
MCHC: 35.7 g/dL (ref 30.0–36.0)
MCV: 86.7 fL (ref 80.0–100.0)
Monocytes Absolute: 0.6 K/uL (ref 0.1–1.0)
Monocytes Relative: 13 %
Neutro Abs: 2.4 K/uL (ref 1.7–7.7)
Neutrophils Relative %: 54 %
Platelet Count: 220 K/uL (ref 150–400)
RBC: 4.72 MIL/uL (ref 4.22–5.81)
RDW: 12.4 % (ref 11.5–15.5)
WBC Count: 4.4 K/uL (ref 4.0–10.5)
nRBC: 0 % (ref 0.0–0.2)

## 2024-02-19 LAB — CMP (CANCER CENTER ONLY)
ALT: 30 U/L (ref 0–44)
AST: 24 U/L (ref 15–41)
Albumin: 4.3 g/dL (ref 3.5–5.0)
Alkaline Phosphatase: 60 U/L (ref 38–126)
Anion gap: 5 (ref 5–15)
BUN: 18 mg/dL (ref 6–20)
CO2: 26 mmol/L (ref 22–32)
Calcium: 11.3 mg/dL — ABNORMAL HIGH (ref 8.9–10.3)
Chloride: 107 mmol/L (ref 98–111)
Creatinine: 1.03 mg/dL (ref 0.61–1.24)
GFR, Estimated: >60 mL/min (ref >60)
Glucose, Bld: 105 mg/dL — ABNORMAL HIGH (ref 70–99)
Potassium: 4.3 mmol/L (ref 3.5–5.1)
Sodium: 138 mmol/L (ref 135–145)
Total Bilirubin: 0.4 mg/dL (ref 0.0–1.2)
Total Protein: 6.9 g/dL (ref 6.5–8.1)

## 2024-02-19 LAB — SEDIMENTATION RATE: Sed Rate: 4 mm/h (ref 0–16)

## 2024-02-20 LAB — CALCIUM, IONIZED: Calcium, Ionized, Serum: 6.2 mg/dL — ABNORMAL HIGH (ref 4.5–5.6)

## 2024-02-27 NOTE — Progress Notes (Signed)
 Marcus    [facility]OLOGY/ONCOLOGY CLINIC [name]  Date of Service: .02/19/2024  Patient Care Team: Marcus Jaynie SQUIBB, DO as PCP - General (Family Medicine)  CHIEF COMPLAINTS/PURPOSE OF CONSULTATION:   Follow-up for continued active surveillance of Hodgkin's lymphoma  HISTORY OF PRESENTING ILLNESS:   Marcus [name] is a wonderful 53 y.o. male who has been referred to us  by Dr .Marcus, Jaynie SQUIBB, DO for evaluation and continued management of Hodgkin's lymphoma.  Patient has previously been seen by Dr. Zdenka Segota MD at [facility] in [facility] for management of his Hodgkin's lymphoma and recently moved to [facility] in January 2018 and wanted to continue his follow-up for Hodgkin's lymphoma here.  Patient presented with bilateral cervical lymphadenopathy first noted in December 2013 and then evaluated with a CT of the neck in January 2014. He notes that he also had some night sweats and some fatigue along with the lymphadenopathy. Patient had a lymph node biopsy of the neck initially which was nondiagnostic.    patient had a PET CT scan on 07/11/2012 which showed extensive nodal hypermetabolic lesion throughout the outer neck bilaterally and mediastinum with SUV up to 11.6   Patient required 2 biopsies of his mediastinal nodes by interventional radiology to make a diagnosis of Hodgkin's lymphoma nodular sclerosis type in March 2014.   patient was treated with ABVD for 6 cycles February through September 2014  He has been in complete remission since then. His last clinic visit with medical oncology was in August 2017 when he had no clinical or lab findings suggestive of disease recurrence. He missed a follow-up in February 2018 since he was in the process of moving to [place].  Patient notes that he is enjoying his job in [place]. He notes no overt new enlarged lymph nodes. No fevers no chills no night sweats no unexpected weight loss. No chest pain no shortness of breath. No  abdominal pain or distention. No new skin rashes.  Interval History:  Marcus [name] is here for his 1 year follow-up for remote history of Hodgkin's lymphoma.  He notes no acute new focal symptoms.  No new lumps or bumps.  No fevers no chills no night sweats no unexpected weight loss. No infection issues. Has been evaluated by Dr. Dartha from endocrinology for workup of his hypercalcemia likely related to primary hyperparathyroidism.  MEDICAL HISTORY:  #1 history of Hodgkin's lymphoma diagnosed in April 2014 status post ABVD 6 cycles completed in September 2014. Has remained in complete remission since then. #2 obesity #3 hypertension  SURGICAL HISTORY: Past Surgical History:  Procedure Laterality Date   COLONOSCOPY  2012   in [place]-normal exam per pt   ganglion cyst removal     LYMPH NODE BIOPSY     neck    SOCIAL HISTORY: Social History   Socioeconomic History   Marital status: Married    Spouse name: Not on file   Number of children: 1   Years of education: Not on file   Highest education level: Not on file  Occupational History   Occupation: Medical Tech  Tobacco Use   Smoking status: Former    Current packs/day: 0.00    Average packs/day: 0.5 packs/day for 12.0 years (6.0 ttl pk-yrs)    Types: Cigarettes    Start date: 71    Quit date: 2010    Years since quitting: 15.7   Smokeless tobacco: Never  Vaping Use   Vaping status: Never Used  Substance and Sexual  Activity   Alcohol use: Not Currently    Alcohol/week: 10.0 standard drinks of alcohol    Types: 10 Standard drinks or equivalent per week    Comment: 8-10 drinks per week per pt   Drug use: No   Sexual activity: Yes  Other Topics Concern   Not on file  Social History Narrative   ** Merged History Encounter **       Social Drivers of Corporate investment banker Strain: Not on file  Food Insecurity: Not on file  Transportation Needs: Not on file  Physical Activity: Not on file  Stress: Not  on file  Social Connections: Not on file  Intimate Partner Violence: Not on file  Works as a Advice worker. Married. Has one son Recently moved to [facility]  from [facility] [place]  in January 2018. Remote history of smoking quit in 2011. Social alcohol use Denies any other drug use  FAMILY HISTORY:  Father had nephrectomy for kidney cancer in 47 Mother had non-Hodgkin's lymphoma followed by Hodgkin's lymphoma and eventually died from adenocarcinoma of unknown primary.   ALLERGIES:  has no known allergies.  MEDICATIONS:  Current Outpatient Medications  Medication Sig Dispense Refill   atorvastatin  (LIPITOR) 20 MG tablet Take 1 tablet (20 mg total) by mouth daily. 90 tablet 3   ibuprofen (ADVIL) 200 MG tablet Take 200 mg by mouth every 6 (six) hours as needed.     pantoprazole  (PROTONIX ) 40 MG tablet Take 1 tablet (40 mg total) by mouth 2 (two) times daily before a meal. 180 tablet 1   No current facility-administered medications for this visit.    REVIEW OF SYSTEMS:    10 Point review of Systems was done is negative except as noted above.   PHYSICAL EXAMINATION: ECOG PERFORMANCE STATUS: 0 - Asymptomatic  . Vitals:   02/19/24 1128  BP: 134/89  Pulse: 81  Resp: 20  Temp: 97.7 F (36.5 C)  SpO2: 97%    Filed Weights   02/19/24 1128  Weight: 252 lb 12.8 oz (114.7 kg)    .Body mass index is 34.29 kg/m.    GENERAL:alert, in no acute distress and comfortable SKIN: no acute rashes, no significant lesions EYES: conjunctiva are pink and non-injected, sclera anicteric OROPHARYNX: MMM, no exudates, no oropharyngeal erythema or ulceration NECK: supple, no JVD LYMPH:  no palpable lymphadenopathy in the cervical, axillary or inguinal regions LUNGS: clear to auscultation b/l with normal respiratory effort HEART: regular rate & rhythm ABDOMEN:  normoactive bowel sounds , non tender, not distended. Extremity: no pedal edema PSYCH: alert &  oriented x 3 with fluent speech NEURO: no focal motor/sensory deficits   LABORATORY DATA:  I have reviewed the data as listed  .    Latest Ref Rng & Units 02/19/2024   10:58 AM 02/07/2023    1:07 PM 02/06/2022    8:53 AM  CBC  WBC 4.0 - 10.5 K/uL 4.4  4.5  3.4   Hemoglobin 13.0 - 17.0 g/dL 85.3  84.9  84.7   Hematocrit 39.0 - 52.0 % 40.9  42.5  43.6   Platelets 150 - 400 K/uL 220  168  208    . CBC    Component Value Date/Time   WBC 4.4 02/19/2024 1058   WBC 3.4 (L) 01/21/2020 0833   RBC 4.72 02/19/2024 1058   HGB 14.6 02/19/2024 1058   HCT 40.9 02/19/2024 1058   PLT 220 02/19/2024 1058   MCV 86.7 02/19/2024 1058  MCH 30.9 02/19/2024 1058   MCHC 35.7 02/19/2024 1058   RDW 12.4 02/19/2024 1058   LYMPHSABS 1.2 02/19/2024 1058   MONOABS 0.6 02/19/2024 1058   EOSABS 0.2 02/19/2024 1058   BASOSABS 0.1 02/19/2024 1058   .    Latest Ref Rng & Units 02/19/2024   10:58 AM 11/27/2023    8:47 AM 06/07/2023    9:01 AM  CMP  Glucose 70 - 99 mg/dL 894  96  89   BUN 6 - 20 mg/dL 18  18  20    Creatinine 0.61 - 1.24 mg/dL 8.96  9.11  8.94   Sodium 135 - 145 mmol/L 138  138  137   Potassium 3.5 - 5.1 mmol/L 4.3  4.7  4.4   Chloride 98 - 111 mmol/L 107  105  105   CO2 22 - 32 mmol/L 26  29  23    Calcium  8.9 - 10.3 mg/dL 88.6  89.2    89.2  89.2    10.7   Total Protein 6.5 - 8.1 g/dL 6.9     Total Bilirubin 0.0 - 1.2 mg/dL 0.4     Alkaline Phos 38 - 126 U/L 60     AST 15 - 41 U/L 24     ALT 0 - 44 U/L 30      Sed rate 3 Ionized calcium  5.7 PTHRP <2 PTH, intact and calcium  Order: [id number] Status: Final result     Visible to patient: Yes (seen)     Next appt: 02/12/2024 at 08:30 AM in Oncology ([facility])     Dx: Hypercalcemia; Persistent cough for 3...   0 Result Notes    Component Ref Range & Units 3 yr ago  PTH 15 - 65 pg/mL 49  Calcium , Total (PTH) 8.7 - 10.2 mg/dL 89.2 High      RADIOGRAPHIC STUDIES: I have personally reviewed the radiological images  as listed and agreed with the findings in the report. No results found.   ASSESSMENT & PLAN:   53 y.o. male with   1) h/o Classical Hodgkins Lymphoma -Nodular sclerosis type Stage IIB  Diagnosed in January 2014 Treated with ABVD 6 cycles completed in September 2014. Patient has remained in complete remission since then.  2) HTN - f/u with PCP  3) Obesity - counseling and active lifestyle and appropriate diet.  4) Hypercalcemia likely from primary parathyroidism    PLAN: -patient is 11 years out from Hodgkin's lymphoma. Risk or recurrence back to general population - I discussed the lab results done today 02/19/2024 CBC shows normal hemoglobin of 14.6, normal WBC count of 4.4k and normal platelets of 220k. CMP shows chronic hypercalcemia with a calcium  of 11.3 Sed rate is normal at 4 Patient has no lab or clinical evidence of Hodgkin's lymphoma recurrence/progression at this time. Patient will continue follow-up with his endocrinologist for further evaluation and management of primary hyperparathyroidism. We discussed and patient is agreeable to continue ongoing follow-up for monitoring of Hodgkin's lymphoma with his PCP at this time.  FOLLOW-UP: Return to clinic with Dr. Onesimo as needed Return to clinic with PCP and endocrinology  The total time spent in the appointment was 25 minutes*.  All of the patient's questions were answered with apparent satisfaction. The patient knows to call the clinic with any problems, questions or concerns.   Emaline Onesimo MD MS AAHIVMS [facility] Hematology/Oncology Physician [facility]  .*Total Encounter Time as defined by the Centers for Medicare and Medicaid Services  includes, in addition to the face-to-face time of a patient visit (documented in the [name] above) non-face-to-face time: obtaining and reviewing outside history, ordering and reviewing medications, tests or procedures, care coordination (communications with other  health care professionals or caregivers) and documentation in the medical record.

## 2024-02-28 LAB — PTH-RELATED PEPTIDE: PTH-related peptide: <2 pmol/L

## 2024-03-01 IMAGING — CT CT CARDIAC CORONARY ARTERY CALCIUM SCORE
3 series · 14 of 20 positions shown, 16 images · non-contrast
Comparison: CT of the chest 02/04/2020
COMPARISON: CT of the chest 02/04/2020

Addendum:
EXAM:
OVER-READ INTERPRETATION  CT CHEST

The following report is an over-read performed by radiologist Dr.
Shoppingholicarka Kalicanin [REDACTED] on 08/09/2021. This
over-read does not include interpretation of cardiac or coronary
anatomy or pathology. The coronary calcium score interpretation by
the cardiologist is attached.
CLINICAL DATA: Cardiovascular Disease Risk stratification
Coronary Calcium Score
TECHNIQUE: A gated, non-contrast computed tomography scan of the heart was
performed using 3mm slice thickness. Axial images were analyzed on a
dedicated workstation. Calcium scoring of the coronary arteries was
performed using the Agatston method.

[Series 2: cascseq 2.0 sa36 70% (id) · axial · 0.39mm/px · z∈[-229,-149]mm · 4 of 68 slices shown]
[im 14/68  vessel]
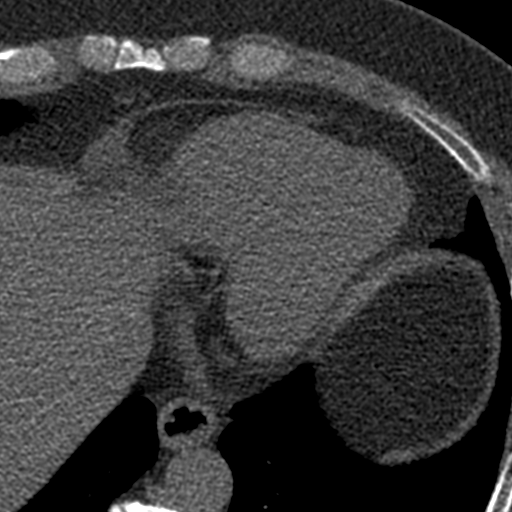
[im 27/68  vessel]
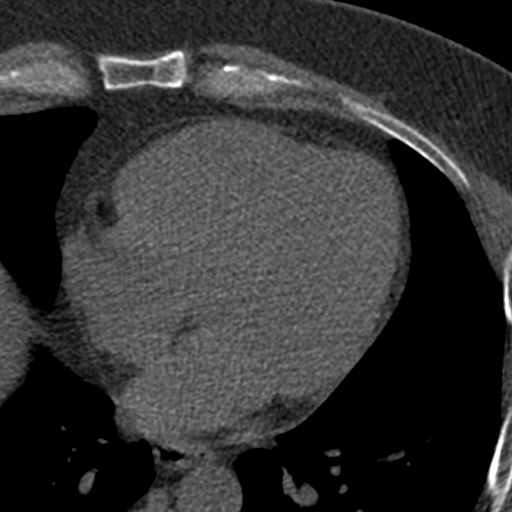
[im 41/68  vessel]
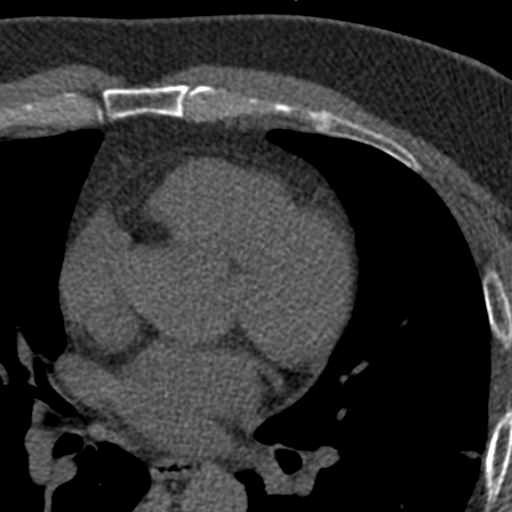
[im 54/68  vessel]
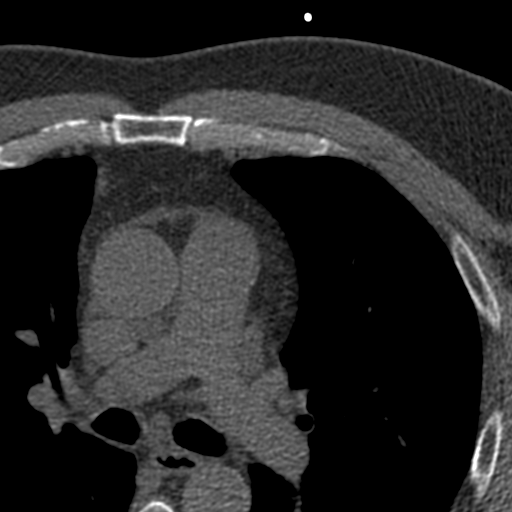

[Series 3: cascseq 2.0 bf37 st · axial · 0.73mm/px · z∈[-233,-145]mm · 5 of 68 slices shown, 7 images]
[im 12/68  vessel]
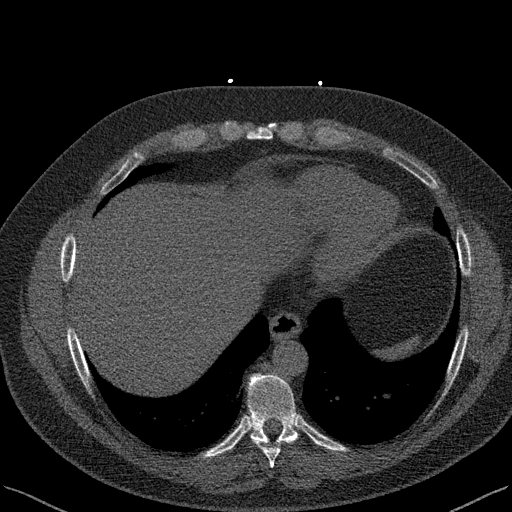
[im 12/68  lung]
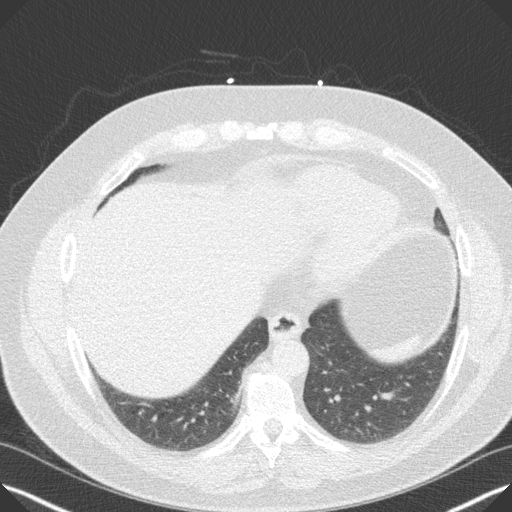
[im 23/68  vessel]
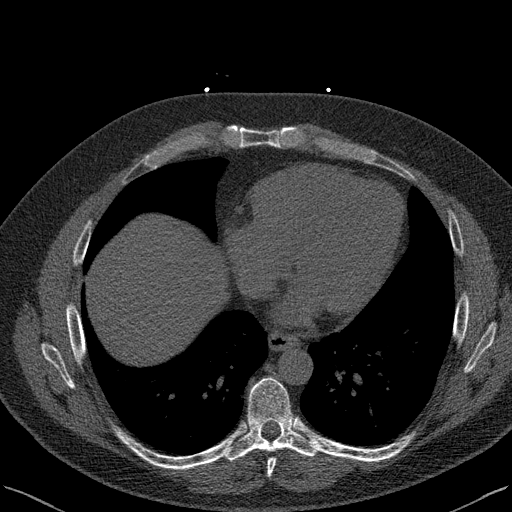
[im 34/68  vessel]
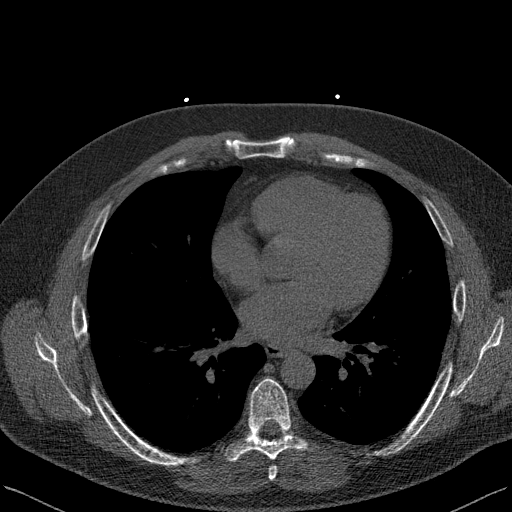
[im 45/68  vessel]
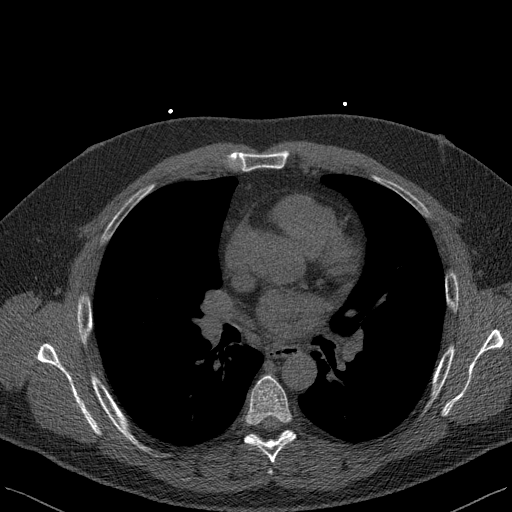
[im 56/68  vessel]
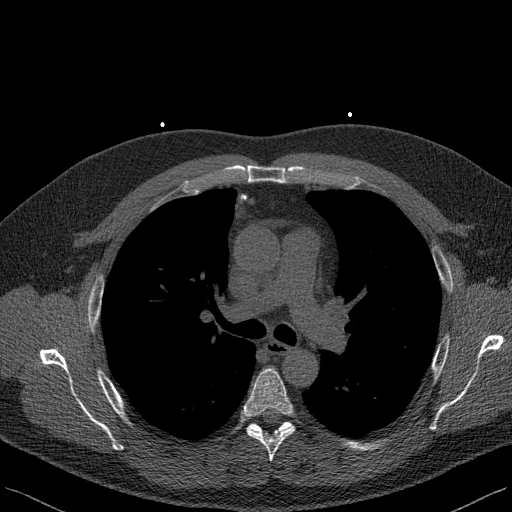
[im 56/68  lung]
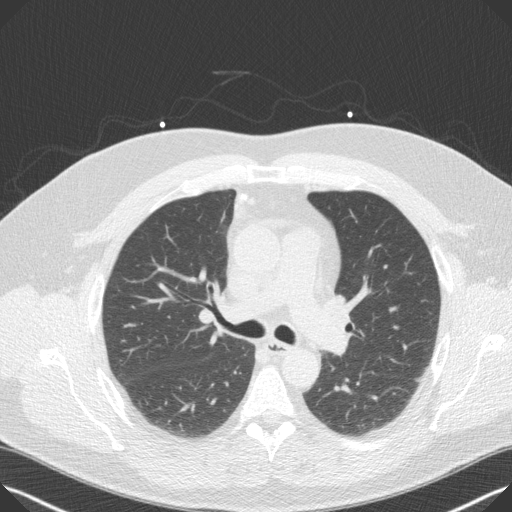

[Series 4: cascseq 2.0 br59 lung · axial · 0.75mm/px · z∈[-233,-145]mm · 5 of 68 slices shown]
[im 12/68  lung]
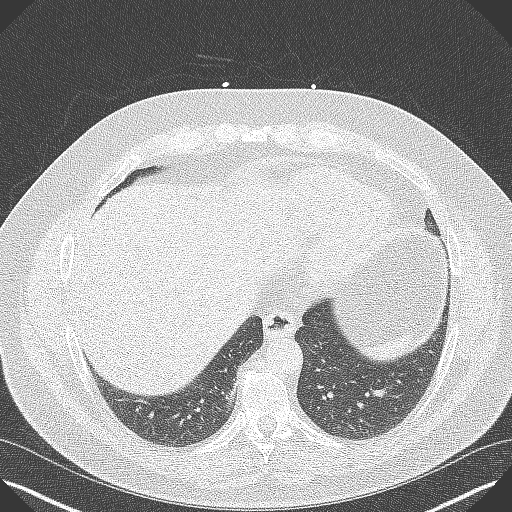
[im 23/68  lung]
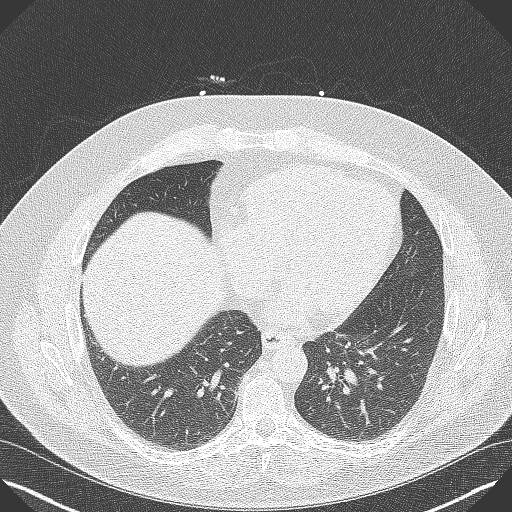
[im 34/68  lung]
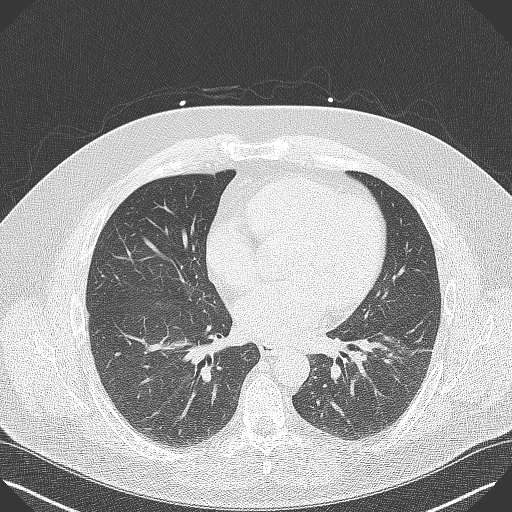
[im 45/68  lung]
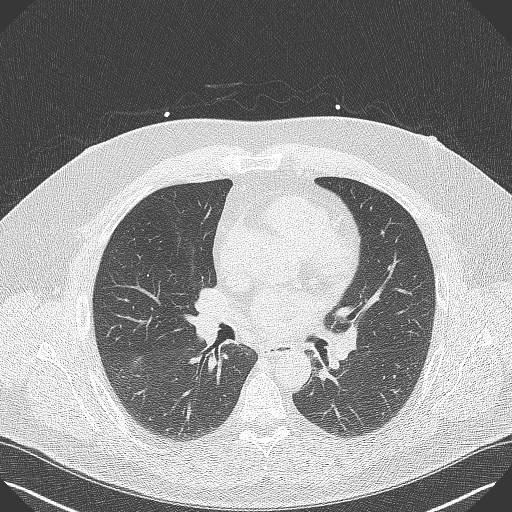
[im 56/68  lung]
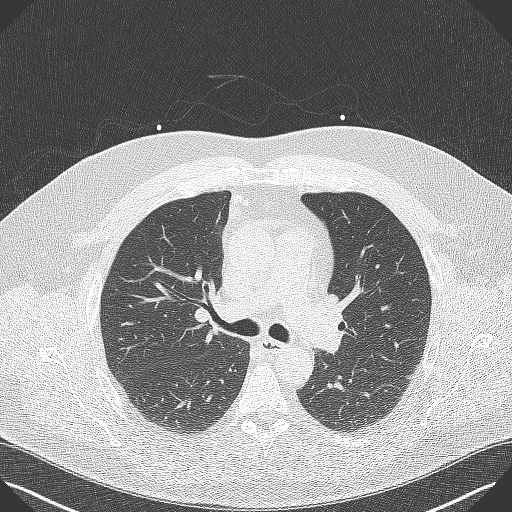

[14 of 20 positions shown; findings below may reference images not displayed]

FINDINGS: Vascular: No significant noncardiac vascular findings.

Mediastinum/Nodes: Stable calcified anterior mediastinal mass to the
right of midline measuring up to 3.5 cm in estimated maximal
diameter. This is consistent with a history treated lymphoma.
Visualized mediastinum and hilar regions demonstrate no additional
visualized masses or lymphadenopathy.

Lungs/Pleura: Visualized lungs show no evidence of pulmonary edema,
consolidation, pneumothorax, nodule or pleural fluid.

Upper Abdomen: Stable evidence of hepatic steatosis.

Musculoskeletal: No chest wall mass or suspicious bone lesions
identified.
IMPRESSION: 1. Stable calcified anterior mediastinal mass consistent with
treated lymphoma.
2. Stable hepatic steatosis.
FINDINGS: Coronary arteries: Normal origins.

Coronary Calcium Score:

Left main: 0

Left anterior descending artery:

Left circumflex artery: 0

Right coronary artery: 0

Total:

Percentile: 75th

Pericardium: Normal.

Ascending Aorta: Normal caliber.

Non-cardiac: See separate report from [REDACTED].
IMPRESSION: Coronary calcium score of 22.8. This was 75th percentile for age-,
race-, and sex-matched controls.



If CAC=0, it is reasonable to withhold statin therapy and reassess
in 5 to 10 years, as long as higher risk conditions are absent
(diabetes mellitus, family history of premature CHD in first degree
relatives (males <55 years; females <65 years), cigarette smoking,
or LDL >=190 mg/dL).

If CAC is 1 to 99, it is reasonable to initiate statin therapy for
patients >=55 years of age.

If CAC is >=100 or >=75th percentile, it is reasonable to initiate
statin therapy at any age.

Cardiology referral should be considered for patients with CAC
scores >=400 or >=75th percentile.

*2728 AHA/ACC/AACVPR/AAPA/ABC/LAKESHIA/KOENIG/EGES/Barhoi/AV/B-LEE/FURKAN MERT
Guideline on the Management of Blood Cholesterol: A Report of the
American College of Cardiology/American Heart Association Task Force
on Clinical Practice Guidelines. J Am Coll Cardiol.
1942;73(24):8095-8289.

*** End of Addendum ***
EXAM:
OVER-READ INTERPRETATION  CT CHEST

The following report is an over-read performed by radiologist Dr.
Shoppingholicarka Kalicanin [REDACTED] on 08/09/2021. This
over-read does not include interpretation of cardiac or coronary
anatomy or pathology. The coronary calcium score interpretation by
the cardiologist is attached.
FINDINGS: Vascular: No significant noncardiac vascular findings.

Mediastinum/Nodes: Stable calcified anterior mediastinal mass to the
right of midline measuring up to 3.5 cm in estimated maximal
diameter. This is consistent with a history treated lymphoma.
Visualized mediastinum and hilar regions demonstrate no additional
visualized masses or lymphadenopathy.

Lungs/Pleura: Visualized lungs show no evidence of pulmonary edema,
consolidation, pneumothorax, nodule or pleural fluid.

Upper Abdomen: Stable evidence of hepatic steatosis.

Musculoskeletal: No chest wall mass or suspicious bone lesions
identified.
IMPRESSION: 1. Stable calcified anterior mediastinal mass consistent with
treated lymphoma.
2. Stable hepatic steatosis.

## 2024-03-27 ENCOUNTER — Other Ambulatory Visit: Payer: Self-pay

## 2024-03-27 DIAGNOSIS — E210 Primary hyperparathyroidism: Secondary | ICD-10-CM

## 2024-04-01 ENCOUNTER — Ambulatory Visit: Admitting: "Endocrinology

## 2024-04-10 ENCOUNTER — Other Ambulatory Visit

## 2024-04-14 ENCOUNTER — Other Ambulatory Visit

## 2024-04-15 LAB — CALCIUM, 24-HOUR URINE WITH CREATININE
CALCIUM/CREATININE RATIO: 282 mg/g{creat} — ABNORMAL HIGH (ref 30–210)
Calcium, 24H Urine: 551 mg/(24.h) — ABNORMAL HIGH
Creatinine, 24H Ur: 1.98 g/(24.h) (ref 0.50–2.15)

## 2024-04-18 ENCOUNTER — Other Ambulatory Visit: Payer: Self-pay | Admitting: "Endocrinology

## 2024-04-18 DIAGNOSIS — E210 Primary hyperparathyroidism: Secondary | ICD-10-CM

## 2024-05-07 ENCOUNTER — Ambulatory Visit: Admitting: "Endocrinology

## 2024-05-07 ENCOUNTER — Encounter: Payer: Self-pay | Admitting: "Endocrinology

## 2024-05-07 DIAGNOSIS — E210 Primary hyperparathyroidism: Secondary | ICD-10-CM

## 2024-05-07 NOTE — Progress Notes (Signed)
 Outpatient Endocrinology Note Marcus Birmingham, MD    Marcus [name] 12-16-1970 [id number]  Referring Provider: Ladora Jaynie SQUIBB, DO Primary Care Provider: Ladora Jaynie SQUIBB, DO Reason for consultation: Subjective   Assessment & Plan  Diagnoses and all orders for this visit:  Primary hyperparathyroidism -     DG BONE DENSITY (DXA)  Hypercalcemia -     DG BONE DENSITY (DXA)   Remote history of Hodgkin's lymphoma status post chemo Follows with oncologist Ionized calcium  has been consistently elevated at least since 01/2020, last at 6 in 01/2023 06/07/23 corrected calcium  is normal at 10.3, with elevated PTH at 147, relatively asymptomatic with Vitamin D  low at 22 No prior PTH elevation noted before this Patient is currently taking a multivitamin with 1600 IU of vitamin D  and 200 mg calcium  daily, and 2000 units of vitamin D  over-the-counter every day If PTH remains elevated after vitamin D  improvement, will follow-up with 24-hour urine collection and bone density It is important to have good hydration to prevent severe hypercalcemia:  - 8 eight ounce glasses of fluids per day.  - low threshold for ER visit for IV fluids if having     nausea/vomiting/diarrhea and cannot keep well hydrated.  04/14/24: 24 H urine calcium  elevated at 541  Ordered labs today + pending DXA; if PTH still elevated will refer for parathyroidectomy given elevated urine calcium   No thyroid issues identified on manual exam nor reported [place] [address]  Return in about 2 months (around 07/08/2024) for visit, labs today.   I have reviewed current medications, nurse's notes, allergies, vital signs, past medical and surgical history, family medical history, and social history for this encounter. Counseled patient on symptoms, examination findings, lab findings, imaging results, treatment decisions and monitoring and prognosis. The patient understood the recommendations and agrees with the treatment plan. All  questions regarding treatment plan were fully answered.  Marcus Birmingham, MD  05/07/24   History of Present Illness HPI   Marcus [name] is a 53 y.o. male referred by Dr. Ladora for follow up of hypercalcemia.    Reports nocturia and occasional right abdominal pinching-last felt 2 weeks ago Denies nausea/vomiting/GERD/constipation/confusion No falls/fractures/bone pains  Not taking any Calcium  Taking Vit D 2000 international units  every day + multivitamin with 1600 IU of vitamin D  and 200 mg calcium  daily  Initial history: He was found to have normal serum calcium  of 10.3 mg/dL in with a high intact PTH level of 147pg/ml.  Ionized calcium  has been consistently elevated at least since 01/2020.  Takes MVI with 1600 IU Vit D and 200mg  calcium  daily  Takes cheese, ice cream rarely  History of Hodgkin's lymphoma around 2014 - s/p chemo   Patient denies a history of kidney stones He  current hematuria No polyuria No nocturia No thirst No renal failure No anorexia No abdominal pain No heartburn No constipation No nausea or vomiting No history of peptic ulcer disease No depression No confusion No excessive fatigue No fracture No osteoporosis No headaches No numbness No tingling No  He a history of taking chronic lithium No He a recent history of thiazide diuretic intake No  He family history of renal stones/hypercalcemia No a personal history of MEN syndromes/medullary thyroid cancer/ pheochromocytoma No  Physical Exam  BP 130/80   Pulse 85   Ht 6' (1.829 m)   Wt 250 lb (113.4 kg)   SpO2 97%   BMI 33.91 kg/m  Constitutional: well developed, well nourished Head: normocephalic, atraumatic Eyes: sclera anicteric, no redness Neck: supple, thyromegaly/thyroid tenderness/thyroid is impression: nodule noted  Lungs: normal respiratory effort Neurology: alert and oriented Skin: dry, no appreciable rashes Musculoskeletal: no appreciable defects Psychiatric:  normal mood and affect   Current Medications Patient's Medications  New Prescriptions   No medications on file  Previous Medications   ATORVASTATIN  (LIPITOR) 20 MG TABLET    Take 1 tablet (20 mg total) by mouth daily.   IBUPROFEN (ADVIL) 200 MG TABLET    Take 200 mg by mouth every 6 (six) hours as needed.   PANTOPRAZOLE  (PROTONIX ) 40 MG TABLET    Take 1 tablet (40 mg total) by mouth 2 (two) times daily before a meal.  Modified Medications   No medications on file  Discontinued Medications   No medications on file    Allergies No Known Allergies  Past Medical History Past Medical History:  Diagnosis Date   Diverticulosis    Esophageal ulceration 12/2020   GERD (gastroesophageal reflux disease)    Heart murmur    Hodgkin's disease (HCC)    In remission.   Hodgkin's lymphoma (HCC) 2014   treated with chemotherapy   Hypertension    Internal hemorrhoids    Palpitations    Tubular adenoma of colon     Past Surgical History Past Surgical History:  Procedure Laterality Date   COLONOSCOPY  2012   in [place]-normal exam per pt   ganglion cyst removal     LYMPH NODE BIOPSY     neck    Family History family history includes Colon cancer (age of onset: 16) in his mother; Colon polyps in his father; Diabetes in his maternal grandmother; Heart Problems in his father; Hodgkin's lymphoma in his maternal grandfather and mother; Kidney cancer in his father; Non-Hodgkin's lymphoma in his mother; Other in his mother; Skin cancer in his sister.  Social History Social History   Socioeconomic History   Marital status: Married    Spouse name: Not on file   Number of children: 1   Years of education: Not on file   Highest education level: Not on file  Occupational History   Occupation: Medical Tech  Tobacco Use   Smoking status: Former    Current packs/day: 0.00    Average packs/day: 0.5 packs/day for 12.0 years (6.0 ttl pk-yrs)    Types: Cigarettes    Start date: 85    Quit  date: 2010    Years since quitting: 15.9   Smokeless tobacco: Never  Vaping Use   Vaping status: Never Used  Substance and Sexual Activity   Alcohol use: Not Currently    Alcohol/week: 10.0 standard drinks of alcohol    Types: 10 Standard drinks or equivalent per week    Comment: 8-10 drinks per week per pt   Drug use: No   Sexual activity: Yes  Other Topics Concern   Not on file  Social History Narrative   ** Merged History Encounter **       Social Drivers of Corporate Investment Banker Strain: Not on file  Food Insecurity: Not on file  Transportation Needs: Not on file  Physical Activity: Not on file  Stress: Not on file  Social Connections: Not on file  Intimate Partner Violence: Not on file    No results found for: CHOL No results found for: HDL No results found for: LDLCALC No results found for: TRIG No results found for: [facility] Lab Results  Component Value Date   CREATININE 1.03 02/19/2024   No results found for: GFR    Component Value Date/Time   NA 138 02/19/2024 1058   K 4.3 02/19/2024 1058   CL 107 02/19/2024 1058   CO2 26 02/19/2024 1058   GLUCOSE 105 (H) 02/19/2024 1058   BUN 18 02/19/2024 1058   CREATININE 1.03 02/19/2024 1058   CREATININE 0.88 11/27/2023 0847   CALCIUM  11.3 (H) 02/19/2024 1058   CALCIUM  10.7 (H) 01/28/2020 1146   PROT 6.9 02/19/2024 1058   ALBUMIN 4.3 02/19/2024 1058   AST 24 02/19/2024 1058   ALT 30 02/19/2024 1058   ALKPHOS 60 02/19/2024 1058   BILITOT 0.4 02/19/2024 1058   GFRNONAA >60 02/19/2024 1058   GFRAA >60 01/28/2020 1146      Latest Ref Rng & Units 02/19/2024   10:58 AM 11/27/2023    8:47 AM 06/07/2023    9:01 AM  BMP  Glucose 70 - 99 mg/dL 894  96  89   BUN 6 - 20 mg/dL 18  18  20    Creatinine 0.61 - 1.24 mg/dL 8.96  9.11  8.94   BUN/Creat Ratio 6 - 22 (calc)  SEE NOTE:  SEE NOTE:   Sodium 135 - 145 mmol/L 138  138  137   Potassium 3.5 - 5.1 mmol/L 4.3  4.7  4.4   Chloride 98 - 111 mmol/L 107   105  105   CO2 22 - 32 mmol/L 26  29  23    Calcium  8.9 - 10.3 mg/dL 88.6  89.2    89.2  89.2    10.7        Component Value Date/Time   WBC 4.4 02/19/2024 1058   WBC 3.4 (L) 01/21/2020 0833   RBC 4.72 02/19/2024 1058   HGB 14.6 02/19/2024 1058   HCT 40.9 02/19/2024 1058   PLT 220 02/19/2024 1058   MCV 86.7 02/19/2024 1058   MCH 30.9 02/19/2024 1058   MCHC 35.7 02/19/2024 1058   RDW 12.4 02/19/2024 1058   LYMPHSABS 1.2 02/19/2024 1058   MONOABS 0.6 02/19/2024 1058   EOSABS 0.2 02/19/2024 1058   BASOSABS 0.1 02/19/2024 1058   No results found for: TSH, [id number]       Parts of this note may have been dictated using voice recognition software. There may be variances in spelling and vocabulary which are unintentional. Not all errors are proofread. Please notify the dino if any discrepancies are noted or if the meaning of any statement is not clear.

## 2024-05-08 LAB — RENAL FUNCTION PANEL
Albumin: 4.3 g/dL (ref 3.6–5.1)
BUN: 17 mg/dL (ref 7–25)
CO2: 27 mmol/L (ref 20–32)
Calcium: 10.6 mg/dL — ABNORMAL HIGH (ref 8.6–10.3)
Chloride: 105 mmol/L (ref 98–110)
Creat: 0.96 mg/dL (ref 0.70–1.30)
Glucose, Bld: 92 mg/dL (ref 65–99)
Phosphorus: 2.1 mg/dL — ABNORMAL LOW (ref 2.5–4.5)
Potassium: 4.6 mmol/L (ref 3.5–5.3)
Sodium: 139 mmol/L (ref 135–146)

## 2024-05-08 LAB — PTH, INTACT AND CALCIUM
Calcium: 10.6 mg/dL — ABNORMAL HIGH (ref 8.6–10.3)
PTH: 122 pg/mL — ABNORMAL HIGH (ref 16–77)

## 2024-07-08 ENCOUNTER — Ambulatory Visit: Admitting: "Endocrinology

## 2024-07-09 ENCOUNTER — Other Ambulatory Visit

## 2024-07-17 ENCOUNTER — Ambulatory Visit: Admitting: "Endocrinology

## 2025-02-24 ENCOUNTER — Ambulatory Visit: Admitting: Hematology

## 2025-02-24 ENCOUNTER — Other Ambulatory Visit
